# Patient Record
Sex: Female | Born: 1965 | Hispanic: Yes | Marital: Single | Smoking: Current some day smoker
Health system: Southern US, Community
[De-identification: ages and names within clinical notes are randomized; demographics above are authoritative.]

## PROBLEM LIST (undated history)

## (undated) DIAGNOSIS — I10 Essential (primary) hypertension: Secondary | ICD-10-CM

## (undated) DIAGNOSIS — E079 Disorder of thyroid, unspecified: Secondary | ICD-10-CM

---

## 2011-07-22 ENCOUNTER — Emergency Department: Payer: Self-pay | Admitting: Emergency Medicine

## 2011-07-22 LAB — COMPREHENSIVE METABOLIC PANEL
Albumin: 3.5 g/dL (ref 3.4–5.0)
Alkaline Phosphatase: 89 U/L (ref 50–136)
Anion Gap: 9 (ref 7–16)
BUN: 17 mg/dL (ref 7–18)
Bilirubin,Total: 0.3 mg/dL (ref 0.2–1.0)
Calcium, Total: 8.6 mg/dL (ref 8.5–10.1)
Chloride: 105 mmol/L (ref 98–107)
Co2: 27 mmol/L (ref 21–32)
Creatinine: 0.52 mg/dL — ABNORMAL LOW (ref 0.60–1.30)
EGFR (African American): >60
EGFR (Non-African Amer.): >60
Glucose: 85 mg/dL (ref 65–99)
Osmolality: 282 (ref 275–301)
Potassium: 3.1 mmol/L — ABNORMAL LOW (ref 3.5–5.1)
SGOT(AST): 23 U/L (ref 15–37)
SGPT (ALT): 20 U/L
Sodium: 141 mmol/L (ref 136–145)
Total Protein: 7.5 g/dL (ref 6.4–8.2)

## 2011-07-22 LAB — CK TOTAL AND CKMB (NOT AT ARMC)
CK, Total: 99 U/L (ref 21–215)
CK-MB: 0.5 ng/mL (ref 0.5–3.6)

## 2011-07-22 LAB — CBC
HCT: 39.6 % (ref 35.0–47.0)
HGB: 13.4 g/dL (ref 12.0–16.0)
MCH: 30.8 pg (ref 26.0–34.0)
MCHC: 33.9 g/dL (ref 32.0–36.0)
MCV: 91 fL (ref 80–100)
Platelet: 277 10*3/uL (ref 150–440)
RBC: 4.36 10*6/uL (ref 3.80–5.20)
RDW: 14.7 % — ABNORMAL HIGH (ref 11.5–14.5)
WBC: 8.7 10*3/uL (ref 3.6–11.0)

## 2011-07-22 LAB — TROPONIN I: Troponin-I: <0.02 ng/mL

## 2011-10-06 ENCOUNTER — Emergency Department: Payer: Self-pay | Admitting: Emergency Medicine

## 2011-10-06 LAB — CK TOTAL AND CKMB (NOT AT ARMC)
CK, Total: 196 U/L (ref 21–215)
CK-MB: 1.6 ng/mL (ref 0.5–3.6)

## 2011-10-06 LAB — BASIC METABOLIC PANEL
Anion Gap: 11 (ref 7–16)
BUN: 28 mg/dL — ABNORMAL HIGH (ref 7–18)
Calcium, Total: 8.9 mg/dL (ref 8.5–10.1)
Chloride: 102 mmol/L (ref 98–107)
Co2: 24 mmol/L (ref 21–32)
Creatinine: 0.85 mg/dL (ref 0.60–1.30)
EGFR (African American): >60
EGFR (Non-African Amer.): >60
Glucose: 93 mg/dL (ref 65–99)
Osmolality: 279 (ref 275–301)
Potassium: 3.2 mmol/L — ABNORMAL LOW (ref 3.5–5.1)
Sodium: 137 mmol/L (ref 136–145)

## 2011-10-06 LAB — CBC
HCT: 39.4 % (ref 35.0–47.0)
HGB: 14.1 g/dL (ref 12.0–16.0)
MCH: 32.1 pg (ref 26.0–34.0)
MCHC: 35.7 g/dL (ref 32.0–36.0)
MCV: 90 fL (ref 80–100)
Platelet: 264 10*3/uL (ref 150–440)
RBC: 4.39 10*6/uL (ref 3.80–5.20)
RDW: 12.9 % (ref 11.5–14.5)
WBC: 9.2 10*3/uL (ref 3.6–11.0)

## 2011-10-06 LAB — LIPASE, BLOOD: Lipase: 130 U/L (ref 73–393)

## 2011-10-06 LAB — TROPONIN I: Troponin-I: <0.02 ng/mL

## 2011-10-07 LAB — CK TOTAL AND CKMB (NOT AT ARMC)
CK, Total: 281 U/L — ABNORMAL HIGH (ref 21–215)
CK-MB: 1.4 ng/mL (ref 0.5–3.6)

## 2011-10-07 LAB — TROPONIN I: Troponin-I: <0.02 ng/mL

## 2011-10-07 LAB — TSH: Thyroid Stimulating Horm: 11.9 u[IU]/mL — ABNORMAL HIGH

## 2011-10-12 ENCOUNTER — Ambulatory Visit: Payer: Self-pay | Admitting: Family Medicine

## 2011-10-21 ENCOUNTER — Observation Stay: Payer: Self-pay | Admitting: Internal Medicine

## 2011-10-21 LAB — BASIC METABOLIC PANEL
Anion Gap: 10 (ref 7–16)
BUN: 24 mg/dL — ABNORMAL HIGH (ref 7–18)
Calcium, Total: 9.4 mg/dL (ref 8.5–10.1)
Chloride: 102 mmol/L (ref 98–107)
Co2: 25 mmol/L (ref 21–32)
Creatinine: 0.62 mg/dL (ref 0.60–1.30)
EGFR (African American): >60
EGFR (Non-African Amer.): >60
Glucose: 102 mg/dL — ABNORMAL HIGH (ref 65–99)
Osmolality: 278 (ref 275–301)
Potassium: 3.5 mmol/L (ref 3.5–5.1)
Sodium: 137 mmol/L (ref 136–145)

## 2011-10-21 LAB — CBC
HCT: 37.1 % (ref 35.0–47.0)
HGB: 13.3 g/dL (ref 12.0–16.0)
MCH: 32 pg (ref 26.0–34.0)
MCHC: 35.9 g/dL (ref 32.0–36.0)
MCV: 89 fL (ref 80–100)
Platelet: 224 10*3/uL (ref 150–440)
RBC: 4.15 10*6/uL (ref 3.80–5.20)
RDW: 13 % (ref 11.5–14.5)
WBC: 9.3 10*3/uL (ref 3.6–11.0)

## 2011-10-21 LAB — TROPONIN I
Troponin-I: <0.02 ng/mL
Troponin-I: <0.02 ng/mL

## 2011-10-21 LAB — CK TOTAL AND CKMB (NOT AT ARMC)
CK, Total: 194 U/L (ref 21–215)
CK-MB: 0.7 ng/mL (ref 0.5–3.6)

## 2011-11-03 ENCOUNTER — Emergency Department: Payer: Self-pay | Admitting: Emergency Medicine

## 2011-11-03 LAB — CBC WITH DIFFERENTIAL/PLATELET
Basophil #: 0 10*3/uL (ref 0.0–0.1)
Basophil %: 0.7 %
Eosinophil #: 0.1 10*3/uL (ref 0.0–0.7)
Eosinophil %: 1.1 %
HCT: 34.5 % — ABNORMAL LOW (ref 35.0–47.0)
HGB: 12.7 g/dL (ref 12.0–16.0)
Lymphocyte #: 1.8 10*3/uL (ref 1.0–3.6)
Lymphocyte %: 28.9 %
MCH: 32.9 pg (ref 26.0–34.0)
MCHC: 36.8 g/dL — ABNORMAL HIGH (ref 32.0–36.0)
MCV: 89 fL (ref 80–100)
Monocyte #: 0.5 x10 3/mm (ref 0.2–0.9)
Monocyte %: 8.1 %
Neutrophil #: 3.8 10*3/uL (ref 1.4–6.5)
Neutrophil %: 61.2 %
Platelet: 253 10*3/uL (ref 150–440)
RBC: 3.86 10*6/uL (ref 3.80–5.20)
RDW: 13.3 % (ref 11.5–14.5)
WBC: 6.3 10*3/uL (ref 3.6–11.0)

## 2014-04-03 IMAGING — CT CT CHEST W/ CM
1 series · 15 of 31 positions shown, 19 images · non-contrast
Comparison: none

REASON FOR EXAM: abn CXR done in ER nodule seen in left lung
COMMENTS:

[Series 2: chest w/ 3.0 i31f 2 · axial · 0.82mm/px · z∈[-343,-85]mm · 15 of 94 slices shown, 19 images]
[im 4/94  mediastinal]
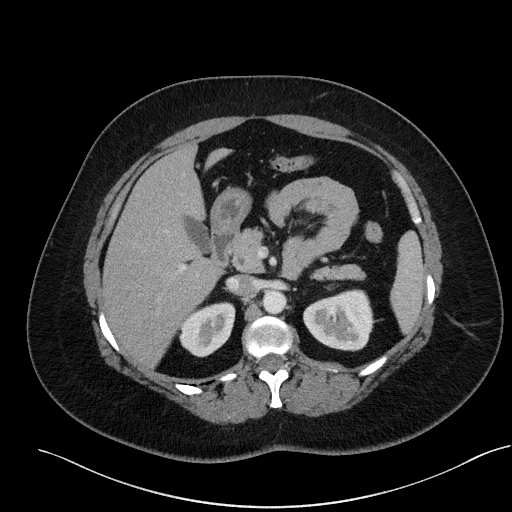
[im 4/94  lung]
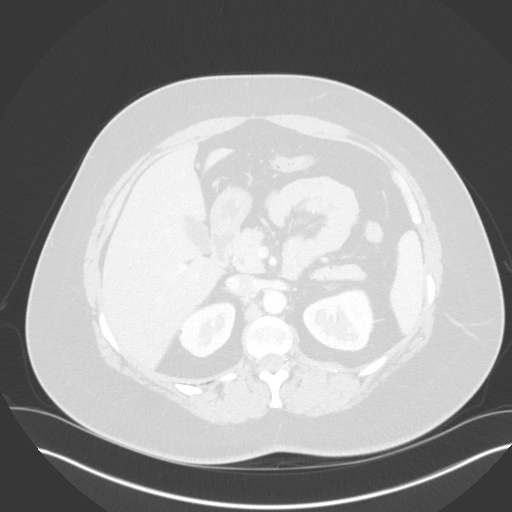
[im 11/94  lung]
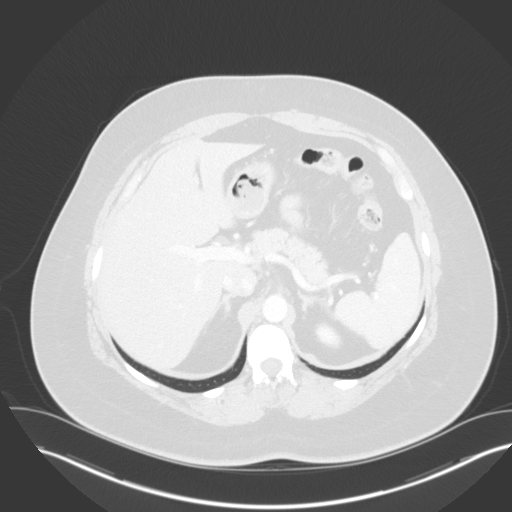
[im 18/94  lung]
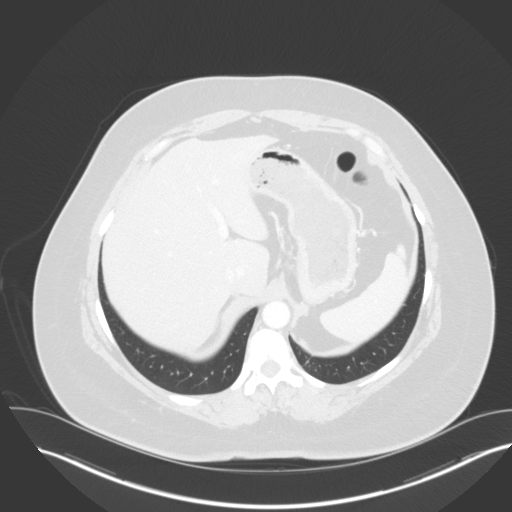
[im 21/94  lung]
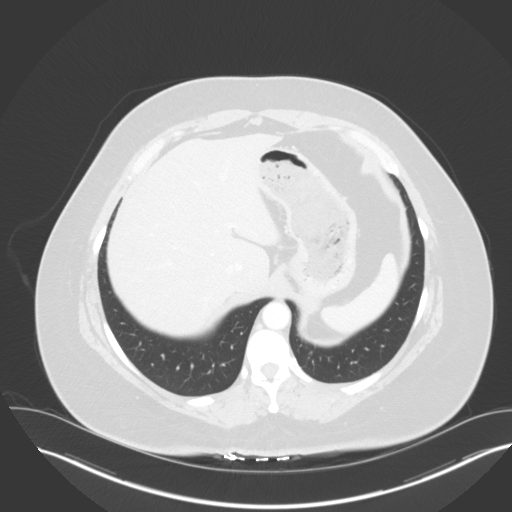
[im 28/94  mediastinal]
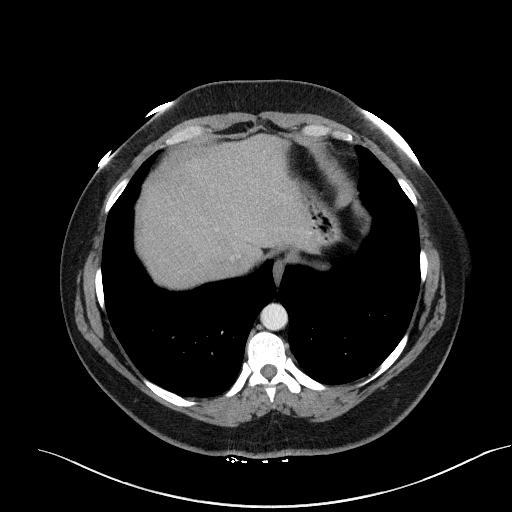
[im 28/94  lung]
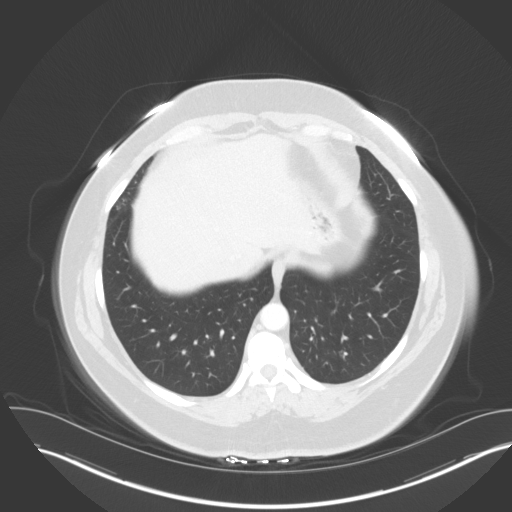
[im 35/94  lung]
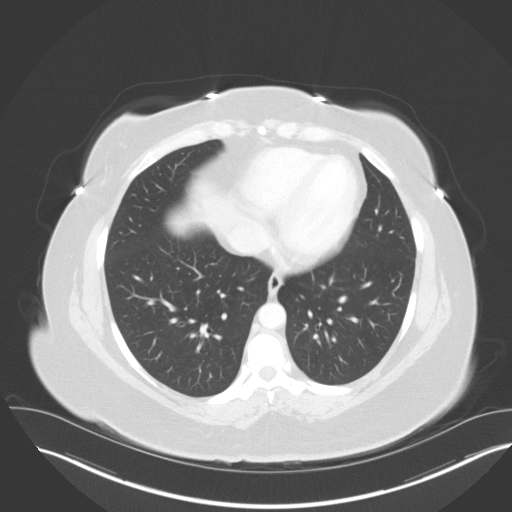
[im 42/94  lung]
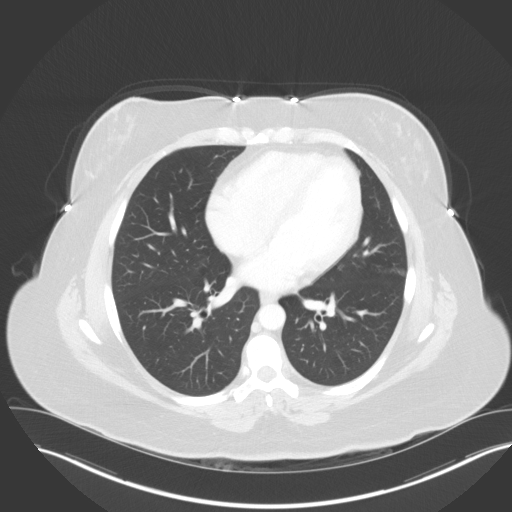
[im 49/94  lung]
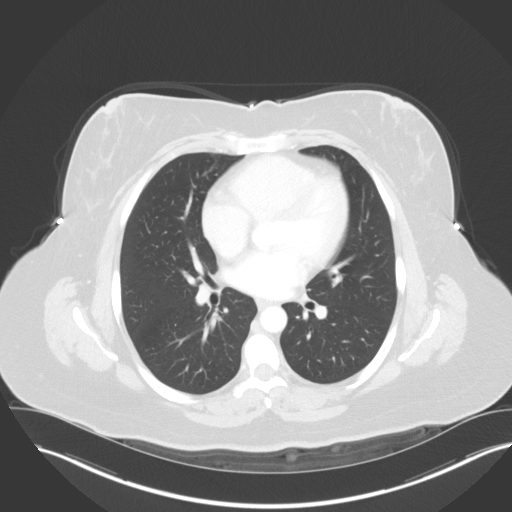
[im 52/94  mediastinal]
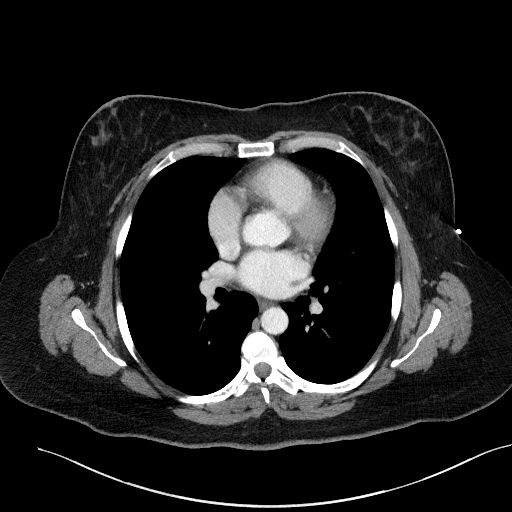
[im 52/94  lung]
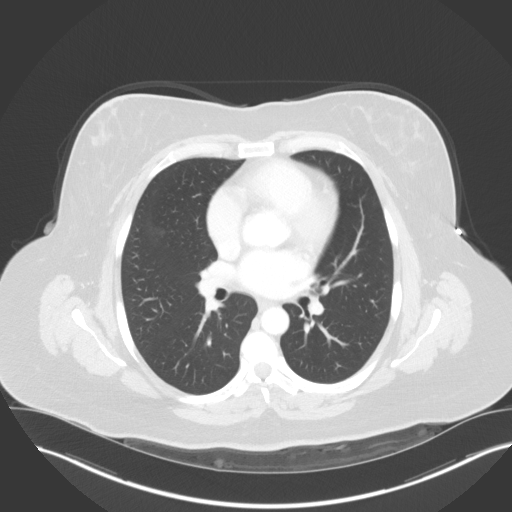
[im 59/94  lung]
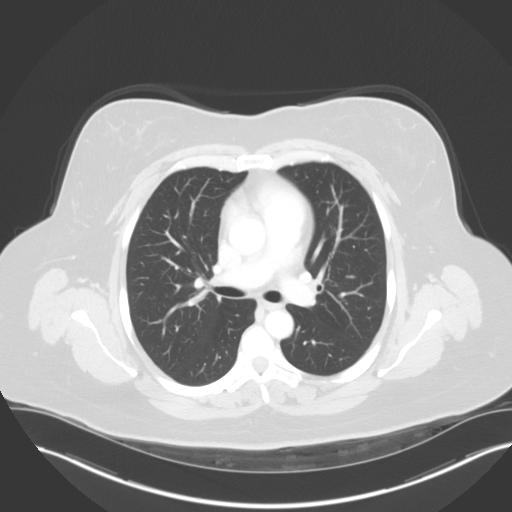
[im 66/94  lung]
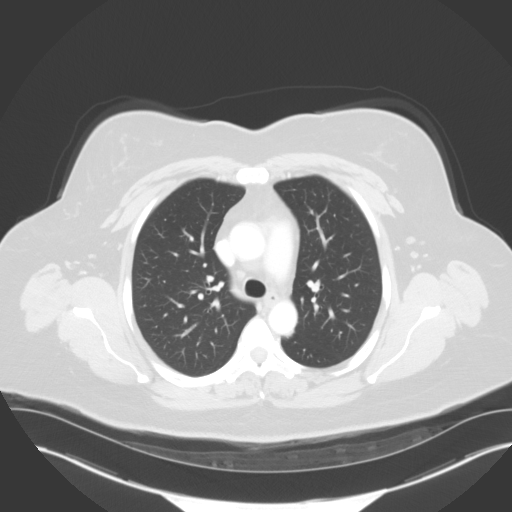
[im 73/94  lung]
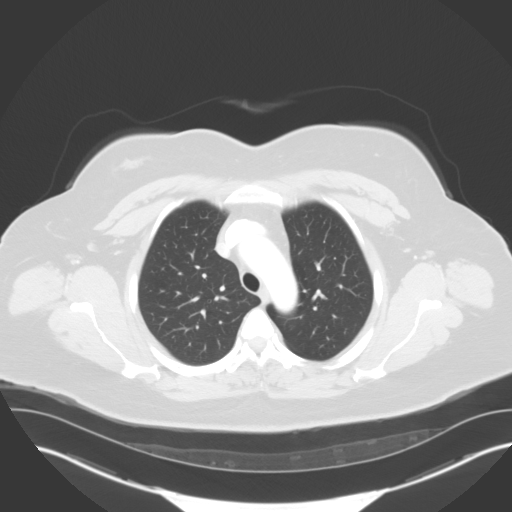
[im 76/94  mediastinal]
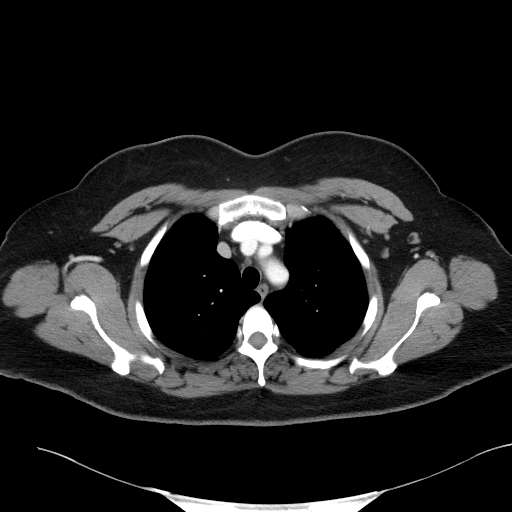
[im 76/94  lung]
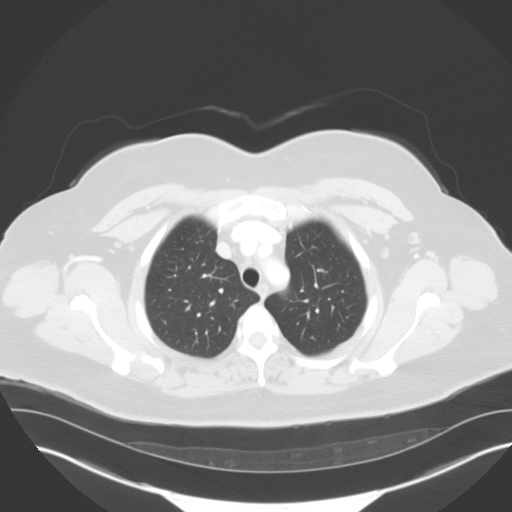
[im 83/94  lung]
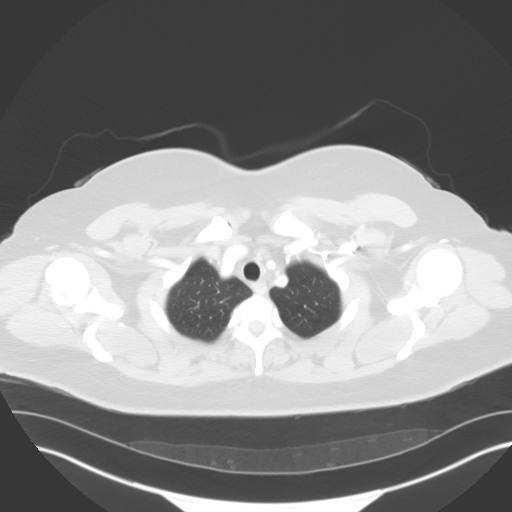
[im 90/94  lung]
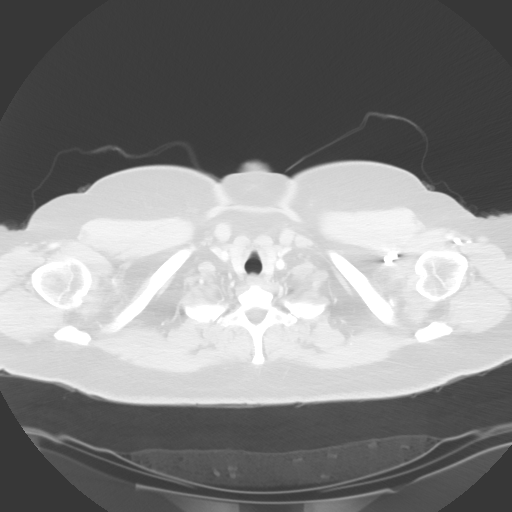

[15 of 31 positions shown; findings below may reference images not displayed]

PROCEDURE:     KCT - KCT CHEST WITH CONTRAST  - October 12, 2011  [DATE]

RESULT:     Axial CT scanning was performed through the chest with
reconstructions at 3 mm intervals and slice thicknesses. Review of
multiplanar reconstructed images was performed separately on the VIA
monitor. The patient received 75 cc of [id number]. Comparison is made to the
chest x-ray dated 06 October, 2011.

In the extreme inferior aspect of the anterior portion of the right upper
lobe there is a soft tissue density nodule present which measures 1.6 x
x 1 cm. No calcification is evident within it. I do not see abnormal nodules
elsewhere within either lung. The lungs are well-expanded. There is no
interstitial nor alveolar infiltrate. The cardiac chambers are normal in
size. The caliber of the thoracic aorta is normal. No pathologic sized
mediastinal or hilar lymph nodes are demonstrated. There is no pleural nor
pericardial effusion. Within the upper abdomen there are no adrenal masses.
The observed portions of the liver and spleen are normal in appearance. The
thoracic vertebral bodies are preserved in height.
IMPRESSION: There is an indeterminate soft tissue density nodule in the
extreme inferior aspect of the right upper lobe. Both benign and malignant
processes must be considered.

Pulmonary consultation or oncology consultation is recommended. Further
evaluation with PET CT scanning is recommended if no previous chest x-rays
are available for comparison. We have no such x-rays on file here at [HOSPITAL].

## 2014-04-25 ENCOUNTER — Emergency Department: Payer: Self-pay | Admitting: Emergency Medicine

## 2014-05-27 NOTE — H&P (Signed)
PATIENT NAME:  Suzanne NicelyLOPEZ, Marijean MR#:  [id number] DATE OF BIRTH:  1965-10-18  DATE OF ADMISSION:  10/21/2011  PRIMARY CARE PHYSICIAN: Suzanne Williams has no primary care physician.   CHIEF COMPLAINT: Chest pain.   HISTORY OF PRESENT ILLNESS: Suzanne Williams is 49 year old Hispanic female with history of hypertension and hypothyroidism and also pulmonary nodule. The patient has had several presentations to the emergency department with chest pain. This morning around 3:00 a.m. Suzanne Williams had chest pain that lasted one hour associated with shortness of breath. The pain was described as a pressure-like feeling in the mid chest with severity reaching 9 on a scale of 10. Suzanne Williams also described tingling in her hands and feet and jaw and Suzanne Williams feels weak. Her symptoms subsided by the time Suzanne Williams got here to the emergency department. Suzanne Williams reports no cough, no hemoptysis, no syncope, no abdominal pain, and no vomiting, although Suzanne Williams had loose stool last evening.   REVIEW OF SYSTEMS: CONSTITUTIONAL: Denies any fever. No chills. No fatigue. EYES: No blurring of vision. No double vision. ENT: No hearing impairment. No sore throat. No dysphagia. CARDIOVASCULAR: No edema and no syncope, but reports chest pain and shortness of breath which has subsided by now. RESPIRATORY: No cough, no sputum production, and no hemoptysis but reports chest pain as above. GASTROINTESTINAL: No abdominal pain, no vomiting, and no diarrhea. GENITOURINARY: No dysuria. No frequency of urination. Her last menstrual period started two days ago and usually it is once a month. MUSCULOSKELETAL: No joint pain or swelling. No muscular pain or swelling. INTEGUMENTARY: No skin rash. No ulcers. NEUROLOGY: No focal weakness. No seizure activity. No headache. PSYCHIATRY: No anxiety or depression. ENDOCRINE: No polyuria or polydipsia. No heat or cold intolerance.   PAST MEDICAL HISTORY:  1. Systemic hypertension. 2. Recurrent chest pain.  3. Hypothyroidism. Recent TSH on 10/06/2011 was  elevated reaching 11.  4. Right lung upper lobe pulmonary nodule. Suzanne Williams had outpatient work-up including CAT scan of the chest done on 10/12/2011. The results were indeterminate and they recommended followup with PET CT scan with pulmonary or oncology consultation.   PAST SURGICAL HISTORY: Negative.   SOCIAL HABITS: Nonsmoker. No history of alcohol or drug abuse.   SOCIAL HISTORY: The patient is single. Suzanne Williams works as a LawyerCNA.   FAMILY HISTORY: Her father died from a heart attack at the age of 50. Her mother suffers from coronary artery disease and diabetes mellitus.   ADMISSION MEDICATIONS:  1. Ranitidine 150 mg twice a day. 2. Levothyroxine 25 mcg once a day. 3. Hydrochlorothiazide 25 mg a day. 4. Atenolol used to take 50 mg twice a day but was stopped upon the recommendation from here during her last visit stating that her blood pressure was low and they told her to hold it.   ALLERGIES: No known drug allergies.   PHYSICAL EXAMINATION:   VITAL SIGNS: Blood pressure 183/100, respiratory rate 20, pulse 94, temperature 98.1, and oxygen saturation 100%.   GENERAL APPEARANCE: Young female lying in bed in no acute distress.   HEAD AND NECK: No pallor. No icterus. No cyanosis.   EARS, NOSE, AND THROAT: Hearing was normal. Nasal mucosa, lips, and tongue were normal.   EYES: Normal iris and conjunctivae. Pupils are about 3 to 4 mm, equal, and the pupils were small and difficult to elicit reactivity to light.   NECK: Supple. Trachea at midline. No thyromegaly. No cervical lymphadenopathy. No masses.   HEART: Normal S1 and S2. No S3 or S4. No murmur. No gallop.  No carotid bruits.   RESPIRATORY: Normal breathing pattern without use of accessory muscles. No rales. No wheezing.   ABDOMEN: Soft without tenderness. No hepatosplenomegaly. No masses. No hernias.   SKIN: No ulcers. No subcutaneous nodules.   MUSCULOSKELETAL: No joint swelling. No clubbing.   NEUROLOGIC: Cranial nerves II through  XII are intact. No focal motor deficit.   PSYCHIATRIC: The patient is alert and oriented x3. Mood and affect were normal.   LABORATORY, DIAGNOSTIC AND RADIOLOGIC DATA: EKG showed normal sinus rhythm at rate of 90 per minute, unremarkable EKG.  CAT scan of the chest done on 10/12/2011 showed right upper lobe pulmonary nodule in the inferior part. Differential diagnosis will be still benign versus malignant pulmonary nodule. The radiologist suggested to do PET scan with CAT scan with pulmonary or oncology consultation.   Serum glucose 102, BUN 24, creatinine 0.6, sodium 137, potassium 3.5, and calcium 9.4. Troponin less than 0.02. CBC showed white count of 9000, hemoglobin 13, hematocrit 37, and platelet count 224.   ASSESSMENT:  1. Chest pain for evaluation.  2. Systemic hypertension, uncontrolled.  3. Right-sided pulmonary nodule at the right upper lobe. 4. Hypothyroidism.   PLAN: We will admit the patient for observation. Follow-up on the troponin every 8 hours times additional two times. Schedule the patient to have nuclear stress test. Resume atenolol but instead of 50 mg twice a day make it 50 mg once a day and continue hydrochlorothiazide.          Followup on the pulmonary nodule should be pursued. This can be done as an outpatient or during this admission.   TIME SPENT EVALUATING THIS PATIENT: More than 50 minutes. ____________________________ [place]ney [place]. Rudene Re, MD amd:slb D: 10/21/2011 07:00:30 ET T: 10/21/2011 09:11:08 ET JOB#: [id number]  cc: [place]ney [place]. Rudene Re, MD, <Dictator> Zollie Scale MD ELECTRONICALLY SIGNED 10/22/2011 22:26

## 2014-05-27 NOTE — Discharge Summary (Signed)
PATIENT NAME:  Suzanne Williams, Mike MR#:  [id number] DATE OF BIRTH:  Dec 17, 1965  DATE OF ADMISSION:  10/21/2011 DATE OF DISCHARGE:  10/21/2011  PRESENTING COMPLAINT: Chest pain.   DISCHARGE DIAGNOSES:  1. Chest pain, appears atypical. Patient was ruled out for acute myocardial infarction.  2. Hypertension.  3. Hypothyroidism.  4. Right pulmonary nodule, workup as outpatient.   PROCEDURE: Myoview stress test which was negative for ischemia. Blood pressure is stable.   MEDICATIONS:  1. Hydrochlorothiazide 25 mg daily.  2. Ranitidine 150 mg p.o. b.i.d.  3. Levothyroxine 88 mcg p.o. daily.  4. Lisinopril 20 mg daily as needed.  5. Aspirin 81 mg daily.   FOLLOWUP: 1. Follow up with 1 to 2 weeks with [facility] for your right pulmonary nodule workup.  2. Patient advised to establish primary care in the area.   DATA:  1. Cardiac enzymes x3 negative.  2. Myoview stress test negative.  3. CBC within normal limits. Basic metabolic panel within normal limits.  4. EKG shows sinus rhythm with Q waves in inferior leads.  5. CT of the chest negative for PE. Indeterminate soft tissue density nodule in the extreme inferior aspect of the right upper lobe.  6. LFTs within normal limits.  7. TSH is 11.5. D-dimer 0.22.   BRIEF SUMMARY OF HOSPITAL COURSE: [name] is a 49 year old Caucasian female with history of hypertension and hypothyroidism along with lung nodules which she is getting workup as outpatient came in with:  1. Chest pain. The patient did have some shortness of breath and weakness both upper and lower extremities with some radiation to the back. Appeared nonspecific, atypical kind of presentation. She remained in sinus rhythm on EKG and telemetry. Her 3 sets of cardiac enzymes were negative. Myoview results were negative as well. Patient tolerated the stress test very well. She did not have any further symptoms of chest pain.  2. Hypertension. Lisinopril was continued.   3. Hypothyroidism, on Synthroid.  4. Patient has been tested for obstructive sleep apnea in the past. She stated her test results were normal.  5. Right upper lobe lung nodule. She is going to follow up [facility] which patient tells me has been set up.  6. Patient was discharged home in a stable condition.   CODE STATUS: SHE REMAINED A FULL CODE.   TIME SPENT: 40 minutes.     ____________________________ Wylie HailSona A. Allena KatzPatel, MD sap:vtd D: 10/31/2011 09:33:55 ET T: 11/01/2011 11:37:21 ET JOB#: [id number]  cc: Jermiah Soderman A. Allena KatzPatel, MD, <Dictator> Willow OraSONA A Lavita Pontius MD ELECTRONICALLY SIGNED 11/01/2011 16:11

## 2015-05-03 ENCOUNTER — Encounter: Payer: Self-pay | Admitting: Medical Oncology

## 2015-05-03 ENCOUNTER — Emergency Department
Admission: EM | Admit: 2015-05-03 | Discharge: 2015-05-03 | Disposition: A | Discharge status: Home / Self Care | Payer: Self-pay | Attending: Emergency Medicine | Admitting: Emergency Medicine

## 2015-05-03 DIAGNOSIS — J209 Acute bronchitis, unspecified: Secondary | ICD-10-CM | POA: Insufficient documentation

## 2015-05-03 DIAGNOSIS — H748X2 Other specified disorders of left middle ear and mastoid: Secondary | ICD-10-CM | POA: Insufficient documentation

## 2015-05-03 DIAGNOSIS — H6591 Unspecified nonsuppurative otitis media, right ear: Secondary | ICD-10-CM | POA: Insufficient documentation

## 2015-05-03 DIAGNOSIS — F17200 Nicotine dependence, unspecified, uncomplicated: Secondary | ICD-10-CM | POA: Insufficient documentation

## 2015-05-03 DIAGNOSIS — I10 Essential (primary) hypertension: Secondary | ICD-10-CM | POA: Insufficient documentation

## 2015-05-03 DIAGNOSIS — H6691 Otitis media, unspecified, right ear: Secondary | ICD-10-CM

## 2015-05-03 HISTORY — DX: Disorder of thyroid, unspecified: E07.9

## 2015-05-03 HISTORY — DX: Essential (primary) hypertension: I10

## 2015-05-03 MED ORDER — FLUTICASONE PROPIONATE 50 MCG/ACT NA SUSP: 2.0000 | Freq: Every day | NASAL | Status: AC

## 2015-05-03 MED ORDER — AMOXICILLIN 875 MG PO TABS: 875.0000 mg | ORAL_TABLET | Freq: Two times a day (BID) | ORAL | Status: DC

## 2015-05-03 MED ORDER — PSEUDOEPH-BROMPHEN-DM 30-2-10 MG/5ML PO SYRP
10.0000 mL | ORAL_SOLUTION | Freq: Four times a day (QID) | ORAL | Status: DC | PRN
Start: 2015-05-03 — End: 2018-01-30

## 2015-05-03 NOTE — ED Notes (Signed)
Pt reports cold sx's with ear ache and sore throat x 2 weeks.

## 2015-05-03 NOTE — Discharge Instructions (Signed)
Bronquitis aguda (Acute Bronchitis) La bronquitis es una inflamacin de las vas respiratorias que se extienden desde la trquea [facility] pulmones (bronquios). La inflamacin produce la formacin de mucosidad. Esto produce tos, que es el sntoma ms frecuente de la bronquitis.  Cuando la bronquitis es [place], generalmente comienza de [place] sbita y desaparece luego de un par de semanas. El hbito de fumar, las alergias y el asma pueden empeorar la bronquitis. Los episodios repetidos de bronquitis pueden causar ms problemas pulmonares.  CAUSAS La causa ms frecuente de bronquitis aguda es el mismo virus que produce el resfro. El virus puede propagarse de Ardelia Mems persona a la otra (contagioso) a travs de la tos y los estornudos, y al tocar objetos contaminados. [place].  Cristy Hilts.  Tos con mucosidad.  Dolores [employer] cuerpo.  Congestin en el pecho.  Escalofros.  Falta de aire.  Dolor de Investment banker, operational. DIAGNSTICO  La bronquitis aguda en general se diagnostica con un examen fsico. El mdico tambin le har preguntas sobre su historia clnica. En algunos casos se indican otros estudios, como radiografas, para Clinical research associate.  TRATAMIENTO  La bronquitis aguda generalmente desaparece en un par de semanas. Con frecuencia, no es Systems analyst. Los medicamentos se indican para aliviar la fiebre o la tos. Generalmente, no es necesario el uso de antibiticos, pero pueden recetarse en ciertas ocasiones. En algunos casos, se recomienda el uso de un inhalador para mejorar la falta de aire y Aeronautical engineer tos. Un vaporizador de aire fro podr ayudarlo a [employer] bronquiales y Armed forces technical officer su eliminacin.  INSTRUCCIONES PARA EL CUIDADO EN EL HOGAR  Descanse lo suficiente.  Beba lquidos en abundancia para mantener la orina de color claro o amarillo plido (excepto que padezca una enfermedad que requiera la restriccin de lquidos). El aumento  de lquidos puede ayudar a que las secreciones respiratorias (esputo) sean menos espesas y a reducir la congestin del pecho, y Mining engineer deshidratacin.  Tome los medicamentos solamente como se lo haya indicado el mdico.  Si le recetaron antibiticos, asegrese de terminarlos, incluso si comienza a sentirse mejor.  Evite fumar o aspirar el humo de otros fumadores. La exposicin al humo del cigarrillo o a irritantes qumicos har que la bronquitis empeore. Si fuma, considere el uso de goma de Higher education careers adviser o la aplicacin de parches en la piel que contengan nicotina para Public house manager los sntomas de abstinencia. Si deja de fumar, sus pulmones se curarn ms rpido.  Reduzca la probabilidad de otro episodio de bronquitis aguda lavando sus manos con frecuencia, evitando a las personas que tengan sntomas y tratando de no tocarse las manos con la boca, la nariz o los ojos.  Concurra a todas las visitas de control como se lo haya indicado el mdico. SOLICITE ATENCIN MDICA SI: Los sntomas no mejoran despus de una semana de [place].  SOLICITE ATENCIN MDICA DE INMEDIATO SI:  Comienza a tener fiebre o escalofros cada vez ms intensos.  Siente dolor en el pecho.  Le falta el aire de manera preocupante.  La flema tiene [place].  Se deshidrata.  Se desmaya o siente que va a desmayarse de forma repetida.  Tiene vmitos que se repiten.  Tiene un dolor de cabeza intenso. ASEGRESE DE QUE:   Comprende estas instrucciones.  Controlar su afeccin.  Recibir ayuda de inmediato si no mejora o si empeora.   Esta informacin no tiene Marine scientist el consejo del mdico. Asegrese de hacerle al mdico cualquier  pregunta que tenga.   Document Released: 01/24/2005 Document Revised: 02/14/2014 Elsevier Interactive Patient Education 2016 [employer]  Otitis media - Adultos (Otitis Media, Adult) La otitis media es el enrojecimiento, el dolor y la inflamacin del odo [place]. La causa de la  otitis media puede ser Vella Raringuna alergia o, ms frecuentemente, una infeccin. Muchas veces ocurre como una complicacin de un resfro comn. SIGNOS Y SNTOMAS Los sntomas de la otitis media son:  Dolor de odos.  Grant RutsFiebre.  Zumbidos en el odo.  Dolor de Turkmenistancabeza.  Prdida de lquido por el odo. DIAGNSTICO Para diagnosticar la otitis media, el mdico examinar su odo con un otoscopio. Este instrumento le permite al mdico observar el interior del odo y examinar el tmpano. El mdico le preguntar acerca de sus sntomas. TRATAMIENTO  Generalmente la otitis media mejora sin tratamiento entre 3 y los [address] das. El mdico podr recetar algunos medicamentos para Primary school teachercalmar el dolor. Si la otitis media no mejora dentro de los [address]  [address] o es recurrente, el mdico puede prescribir antibiticos si sospecha que la causa es una infeccin bacteriana. INSTRUCCIONES PARA EL CUIDADO EN EL HOGAR   Si le recetaron antibiticos, asegrese de terminarlos, incluso si comienza a sentirse mejor.  Tome los medicamentos solamente como se lo haya indicado el mdico.  Concurra a todas las visitas de control como se lo haya indicado el mdico. SOLICITE ATENCIN MDICA SI:  Tiene otitis media solo en un odo o sangra por la nariz, o ambas cosas.  Advierte un bulto en el cuello.  No mejora luego de 3 a 5 das.  Empeora en lugar de mejorar. SOLICITE ATENCIN MDICA DE INMEDIATO SI:   Aumenta el dolor y no puede controlarlo con Tourist information centre managerla medicacin.  Observa hinchazn, irritacin o dolor alrededor del odo o rigidez en el cuello.  Nota que una parte de su rostro est paralizada.  Nota que el hueso que se encuentra detrs de la oreja (mastoides) le duele al tocarlo. ASEGRESE DE QUE:   Comprende estas instrucciones.  Controlar su afeccin.  Recibir ayuda de inmediato si no mejora o si empeora.   Esta informacin no tiene Theme park managercomo fin reemplazar el consejo del mdico. Asegrese de hacerle al mdico cualquier pregunta que  tenga.   Document Released: 11/03/2004 Document Revised: 02/14/2014 Elsevier Interactive Patient Education [employer]2016 Elsevier Inc.

## 2015-05-03 NOTE — ED Notes (Signed)
Pt verbalized understanding of discharge instructions. NAD at this time. 

## 2015-05-03 NOTE — ED Provider Notes (Signed)
[facility] Emergency Department Provider Note  ____________________________________________  Time seen: Approximately 3:56 PM  I have reviewed the triage vital signs and the nursing notes.   HISTORY  Chief Complaint URI; Sore Throat; and Otalgia    HPI Suzanne Williams is a 50 y.o. female , NAD, presents emergency Department with 2-3 weeks of cold symptoms. States illness began with fever, chills, sweats and body aches. Patient was exposed to family members who had influenza were treated with Tamiflu therefore she decided to wait the illness out without medical treatment. Has had bilateral ear pain, sore throat and chest congestion that has not resolved over this time. Has tried over-the-counter medications with no significant relief of symptoms. Denies any drainage from the ears. Has not had any headaches, dizziness. Denies chest pain, back pain, abdominal pain, nausea, vomiting, diarrhea.   Past Medical History  Diagnosis Date  . Hypertension   . Thyroid disease     There are no active problems to display for this patient.   History reviewed. No pertinent past surgical history.  Current Outpatient Rx  Name  Route  Sig  Dispense  Refill  . amoxicillin (AMOXIL) 875 MG tablet   Oral   Take 1 tablet (875 mg total) by mouth 2 (two) times daily.   20 tablet   0   . brompheniramine-pseudoephedrine-DM 30-2-10 MG/5ML syrup   Oral   Take 10 mLs by mouth 4 (four) times daily as needed.   200 mL   0   . fluticasone (FLONASE) 50 MCG/ACT nasal spray   Each Nare   Place 2 sprays into both nostrils daily.   16 g   0     Allergies Review of patient's allergies indicates no known allergies.  No family history on file.  Social History Social History  Substance Use Topics  . Smoking status: Current Every Day Smoker  . Smokeless tobacco: None  . Alcohol Use: None     Review of Systems  Constitutional: No fever/chills, fatigue Eyes: No visual  changes. No discharge, redness, swelling ENT: Positive nasal congestion, ear pain, sore throat. No sinus pressure. Cardiovascular: No chest pain. Respiratory: Positive chest congestion, cough. No shortness of breath. No wheezing.  Gastrointestinal: No abdominal pain.  No nausea, vomiting.  No diarrhea. Musculoskeletal: Negative for back pain.  Skin: Negative for rash. Neurological: Negative for headaches, focal weakness or numbness. 10-point ROS otherwise negative.  ____________________________________________   PHYSICAL EXAM:  VITAL SIGNS: ED Triage Vitals  Enc Vitals Group     BP 05/03/15 1540 133/66 mmHg     Pulse Rate 05/03/15 1540 80     Resp 05/03/15 1540 20     Temp 05/03/15 1540 98.1 F (36.7 C)     Temp Source 05/03/15 1540 Oral     SpO2 05/03/15 1540 98 %     Weight 05/03/15 1539 180 lb (81.647 kg)     Height 05/03/15 1539 5\' 2"  (1.575 m)     Head Cir --      Peak Flow --      Pain Score 05/03/15 1539 5     Pain Loc --      Pain Edu? --      Excl. in GC? --     Constitutional: Alert and oriented. Well appearing and in no acute distress. Eyes: Conjunctivae are normal. PERRL. EOMI without pain.  Head: Atraumatic. ENT:      Ears: Right TM with moderate serous effusion and moderate erythema with mild  retraction. No perforation and light reflex is dull. Left TM with mild serous effusion and mild bulging but no erythema or perforation. White reflexes within normal limits.      Nose: No congestion/rhinnorhea.      Mouth/Throat: Mucous membranes are moist. Clear postnasal drip. Pharynx without erythema, swelling, exudates. Neck: Supple with full range of motion. Hematological/Lymphatic/Immunilogical: No cervical lymphadenopathy. Cardiovascular: Normal rate, regular rhythm. Normal S1 and S2.  Good peripheral circulation. Respiratory: Normal respiratory effort without tachypnea or retractions. Lungs CTAB with breath sounds noted throughout all lung fields. Neurologic:   Normal speech and language. No gross focal neurologic deficits are appreciated.  Skin:  Skin is warm, dry and intact. No rash noted. Psychiatric: Mood and affect are normal. Speech and behavior are normal. Patient exhibits appropriate insight and judgement.   ____________________________________________   LABS  None  ____________________________________________  EKG  None ____________________________________________  RADIOLOGY  None ____________________________________________    PROCEDURES  Procedure(s) performed: None    Medications - No data to display   ____________________________________________   INITIAL IMPRESSION / ASSESSMENT AND PLAN / ED COURSE  Patient's diagnosis is consistent with acute right otitis media and bronchitis after viral infection. Patient will be discharged home with prescriptions for amoxicillin, Bromfed-DM, Flonase to take as directed. May continue Tylenol or ibuprofen as needed for aches or pains. Patient is to follow up with [facility] community clinic if symptoms persist past this treatment course. Patient is given ED precautions to return to the ED for any worsening or new symptoms.    ____________________________________________  FINAL CLINICAL IMPRESSION(S) / ED DIAGNOSES  Final diagnoses:  Acute right otitis media, recurrence not specified, unspecified otitis media type  Acute bronchitis, unspecified organism      NEW MEDICATIONS STARTED DURING THIS VISIT:  New Prescriptions   AMOXICILLIN (AMOXIL) 875 MG TABLET    Take 1 tablet (875 mg total) by mouth 2 (two) times daily.   BROMPHENIRAMINE-PSEUDOEPHEDRINE-DM 30-2-10 MG/5ML SYRUP    Take 10 mLs by mouth 4 (four) times daily as needed.   FLUTICASONE (FLONASE) 50 MCG/ACT NASAL SPRAY    Place 2 sprays into both nostrils daily.         Hope Pigeon, PA-C 05/03/15 1629  Jennye Moccasin, MD 05/03/15 [phone]

## 2015-05-09 ENCOUNTER — Emergency Department: Payer: Self-pay

## 2015-05-09 ENCOUNTER — Emergency Department
Admission: EM | Admit: 2015-05-09 | Discharge: 2015-05-09 | Disposition: A | Discharge status: Home / Self Care | Payer: Self-pay | Attending: Emergency Medicine | Admitting: Emergency Medicine

## 2015-05-09 ENCOUNTER — Encounter: Payer: Self-pay | Admitting: *Deleted

## 2015-05-09 DIAGNOSIS — J40 Bronchitis, not specified as acute or chronic: Secondary | ICD-10-CM | POA: Insufficient documentation

## 2015-05-09 DIAGNOSIS — Z792 Long term (current) use of antibiotics: Secondary | ICD-10-CM | POA: Insufficient documentation

## 2015-05-09 DIAGNOSIS — I10 Essential (primary) hypertension: Secondary | ICD-10-CM | POA: Insufficient documentation

## 2015-05-09 DIAGNOSIS — Z7951 Long term (current) use of inhaled steroids: Secondary | ICD-10-CM | POA: Insufficient documentation

## 2015-05-09 DIAGNOSIS — F17210 Nicotine dependence, cigarettes, uncomplicated: Secondary | ICD-10-CM | POA: Insufficient documentation

## 2015-05-09 MED ORDER — IPRATROPIUM-ALBUTEROL 0.5-2.5 (3) MG/3ML IN SOLN
3.0000 mL | Freq: Once | RESPIRATORY_TRACT | Status: AC
  Administered 2015-05-09: 3 mL via RESPIRATORY_TRACT
  Filled 2015-05-09: qty 3

## 2015-05-09 NOTE — ED Notes (Signed)
States she was diagnoised with the flu, ear infection, and bronchitis 3 weeks ago and was given antibiotcs (amoxillin) that she has taken for 4 days and states she is not feeling any better, pt awake and alert in no acute distress

## 2015-05-09 NOTE — Discharge Instructions (Signed)
Infección del tracto respiratorio superior, adultos  (Upper Respiratory Infection, Adult)  La mayoría de las infecciones del tracto respiratorio superior son infecciones virales de las vías que llevan el aire a los pulmones. Un infección del tracto respiratorio superior afecta la nariz, la garganta y las vías respiratorias superiores. El tipo más frecuente de infección del tracto respiratorio superior es la nasofaringitis, que habitualmente se conoce como "resfrío común".  Las infecciones del tracto respiratorio superior siguen su curso y por lo general se curan solas. En la mayoría de los casos, la infección del tracto respiratorio superior no requiere atención médica, pero a veces, después de una infección viral, puede surgir una infección bacteriana en las vías respiratorias superiores. Esto se conoce como infección secundaria. Las infecciones sinusales y en el oído medio son tipos frecuentes de infecciones secundarias en el tracto respiratorio superior.  La neumonía bacteriana también puede complicar un cuadro de infección del tracto respiratorio superior. Este tipo de infección puede empeorar el asma y la enfermedad pulmonar obstructiva crónica (EPOC). En algunos casos, estas complicaciones pueden requerir atención médica de emergencia y poner en peligro la vida.   CAUSAS  Casi todas las infecciones del tracto respiratorio superior se deben a los virus. Un virus es un tipo de microbio que puede contagiarse de una persona a otra.   FACTORES DE RIESGO  Puede estar en riesgo de sufrir una infección del tracto respiratorio superior si:   · Fuma.  · Tiene una enfermedad pulmonar o cardíaca crónica.  · Tiene debilitado el sistema de defensa (inmunitario) del cuerpo.  · Es muy joven o de edad muy avanzada.  · Tiene asma o alergias nasales.  · Trabaja en áreas donde hay mucha gente o poca ventilación.  · Trabaja en una escuela o en un centro de atención médica.  SIGNOS Y SÍNTOMAS   Habitualmente, los síntomas aparecen  de 2 a 3 días después de entrar en contacto con el virus del resfrío. La mayoría de las infecciones virales en el tracto respiratorio superior duran de 7 a 10 días. Sin embargo, las infecciones virales en el tracto respiratorio superior a causa del virus de la gripe pueden durar de 14 a 18 días y, habitualmente, son más graves. Entre los síntomas se pueden incluir los siguientes:   · Secreción o congestión nasal.  · Estornudos.  · Tos.  · Dolor de garganta.  · Dolor de cabeza.  · Fatiga.  · Fiebre.  · Pérdida del apetito.  · Dolor en la frente, detrás de los ojos y por encima de los pómulos (dolor sinusal).  · Dolores musculares.  DIAGNÓSTICO   El médico puede diagnosticar una infección del tracto respiratorio superior mediante los siguientes estudios:  · Examen físico.  · Pruebas para verificar si los síntomas no se deben a otra afección, por ejemplo:    Faringitis estreptocócica.    Sinusitis.    Neumonía.    Asma.  TRATAMIENTO   Esta infección desaparece sola, con el tiempo. No puede curarse con medicamentos, pero a menudo se prescriben para aliviar los síntomas. Los medicamentos pueden ser útiles para lo siguiente:  · Bajar la fiebre.  · Reducir la tos.  · Aliviar la congestión nasal.  INSTRUCCIONES PARA EL CUIDADO EN EL HOGAR   · Tome los medicamentos solamente como se lo haya indicado el médico.  · A fin de aliviar el dolor de garganta, haga gárgaras con solución salina templada o consuma caramelos para la tos, como se lo haya indicado el médico.  · Use un humidificador   de vapor cálido o inhale el vapor de la ducha para aumentar la humedad del aire. Esto facilitará la respiración.  · Beba suficiente líquido para mantener la orina clara o de color amarillo pálido.  · Consuma sopas y otros caldos transparentes, y aliméntese bien.  · Descanse todo lo que sea necesario.  · Regrese al trabajo cuando la temperatura se le haya normalizado o cuando el médico lo autorice. Es posible que deba quedarse en su casa durante  un tiempo prolongado, para no infectar a los demás. También puede usar un barbijo y lavarse las manos con cuidado para evitar la propagación del virus.  · Aumente el uso del inhalador si tiene asma.  · No consuma ningún producto que contenga tabaco, lo que incluye cigarrillos, tabaco de mascar o cigarrillos electrónicos. Si necesita ayuda para dejar de fumar, consulte al médico.  PREVENCIÓN   La mejor manera de protegerse de un resfrío es mantener una higiene adecuada.   · Evite el contacto oral o físico con personas que tengan síntomas de resfrío.  · En caso de contacto, lávese las manos con frecuencia.  No hay pruebas claras de que la vitamina C, la vitamina E, la equinácea o el ejercicio reduzcan la probabilidad de contraer un resfrío. Sin embargo, siempre se recomienda descansar mucho, hacer ejercicio y alimentarse bien.   SOLICITE ATENCIÓN MÉDICA SI:   · Su estado empeora en lugar de mejorar.  · Los medicamentos no logran controlar los síntomas.  · Tiene escalofríos.  · La sensación de falta de aire empeora.  · Tiene mucosidad marrón o roja.  · Tiene secreción nasal amarilla o marrón.  · Le duele la cara, especialmente al inclinarse hacia adelante.  · Tiene fiebre.  · Tiene los ganglios del cuello hinchados.  · Siente dolor al tragar.  · Tiene zonas blancas en la parte de atrás de la garganta.  SOLICITE ATENCIÓN MÉDICA DE INMEDIATO SI:   · Tiene síntomas intensos o persistentes de:    Dolor de cabeza.    Dolor de oídos.    Dolor sinusal.    Dolor en el pecho.  · Tiene enfermedad pulmonar crónica y cualquiera de estos síntomas:    Sibilancias.    Tos prolongada.    Tos con sangre.    Cambio en la mucosidad habitual.  · Presenta rigidez en el cuello.  · Tiene cambios en:    La visión.    La audición.    El pensamiento.    El estado de ánimo.  ASEGÚRESE DE QUE:   · Comprende estas instrucciones.  · Controlará su afección.  · Recibirá ayuda de inmediato si no mejora o si empeora.     Esta información no tiene como  fin reemplazar el consejo del médico. Asegúrese de hacerle al médico cualquier pregunta que tenga.     Document Released: 11/03/2004 Document Revised: 06/10/2014  Elsevier Interactive Patient Education ©2016 [employer]

## 2015-05-09 NOTE — ED Notes (Signed)
Patient transported to x-ray. ?

## 2015-05-11 NOTE — ED Provider Notes (Signed)
[facility] Emergency Department Provider Note  ____________________________________________    I have reviewed the triage vital signs and the nursing notes.   HISTORY  Chief Complaint Otalgia; Chest Pain; and Cough    HPI Merleen NicelyLourdes Kitko is a 50 y.o. female who presents with complaints of cough. She notes that she has been coughing for several weeks and seemed to improve re-flea but now she is coughing again. She was treated with amoxicillin with little improvement. She denies shortness of breath. No recent travel.     Past Medical History  Diagnosis Date  . Hypertension   . Thyroid disease     There are no active problems to display for this patient.   History reviewed. No pertinent past surgical history.  Current Outpatient Rx  Name  Route  Sig  Dispense  Refill  . amoxicillin (AMOXIL) 875 MG tablet   Oral   Take 1 tablet (875 mg total) by mouth 2 (two) times daily.   20 tablet   0   . brompheniramine-pseudoephedrine-DM 30-2-10 MG/5ML syrup   Oral   Take 10 mLs by mouth 4 (four) times daily as needed.   200 mL   0   . fluticasone (FLONASE) 50 MCG/ACT nasal spray   Each Nare   Place 2 sprays into both nostrils daily.   16 g   0     Allergies Review of patient's allergies indicates no known allergies.  History reviewed. No pertinent family history.  Social History Social History  Substance Use Topics  . Smoking status: Current Every Day Smoker  . Smokeless tobacco: None  . Alcohol Use: None    Review of Systems  Constitutional: Negative for fever. Eyes: Negative for redness ENT: Negative for sore throat Cardiovascular: Negative for chest pain Respiratory: Negative for shortness of breath.  Musculoskeletal: Negative for Myalgias Skin: Negative for rash. Neurological: Negative for headache Psychiatric: no anxiety    ____________________________________________   PHYSICAL EXAM:  VITAL SIGNS: ED Triage Vitals   Enc Vitals Group     BP 05/09/15 1634 125/80 mmHg     Pulse Rate 05/09/15 1634 83     Resp 05/09/15 1634 18     Temp 05/09/15 1634 98.7 F (37.1 C)     Temp Source 05/09/15 1634 Oral     SpO2 05/09/15 1634 98 %     Weight 05/09/15 1634 180 lb (81.647 kg)     Height 05/09/15 1634 5\' 1"  (1.549 m)     Head Cir --      Peak Flow --      Pain Score 05/09/15 1635 7     Pain Loc --      Pain Edu? --      Excl. in GC? --      Constitutional: Alert and oriented. Well appearing and in no distress.  Eyes: Conjunctivae are normal. No erythema or injection ENT   Head: Normocephalic and atraumatic.   Mouth/Throat: Mucous membranes are moist. Cardiovascular: Normal rate, regular rhythm. Normal and symmetric distal pulses are present in the upper extremities. Respiratory: Normal respiratory effort without tachypnea nor retractions. Breath sounds are clear and equal bilaterally.  Gastrointestinal: Soft and non-tender in all quadrants. No distention. There is no CVA tenderness.  Musculoskeletal: Nontender with normal range of motion in all extremities. No lower extremity tenderness nor edema. Neurologic:  Normal speech and language. No gross focal neurologic deficits are appreciated. Skin:  Skin is warm, dry and intact. No rash noted. Psychiatric: Mood  and affect are normal. Patient exhibits appropriate insight and judgment.  ____________________________________________    LABS (pertinent positives/negatives)  Labs Reviewed - No data to display  ____________________________________________   EKG  None  ____________________________________________    RADIOLOGY  Chest x-ray unremarkable  ____________________________________________   PROCEDURES  Procedure(s) performed: none  Critical Care performed: none  ____________________________________________   INITIAL IMPRESSION / ASSESSMENT AND PLAN / ED COURSE  Pertinent labs & imaging results that were available  during my care of the patient were reviewed by me and considered in my medical decision making (see chart for details).  Patient well-appearing and in no distress. Symptoms consistent with bronchitis. We will give DuoNeb and check chest x-ray  ____________________________________________   FINAL CLINICAL IMPRESSION(S) / ED DIAGNOSES  Final diagnoses:  Bronchitis          Jene Every, MD 05/11/15 [phone]

## 2015-12-15 ENCOUNTER — Ambulatory Visit
Admission: RE | Admit: 2015-12-15 | Discharge: 2015-12-15 | Disposition: A | Discharge status: Home / Self Care | Payer: Self-pay | Source: Ambulatory Visit | Attending: Family Medicine | Admitting: Family Medicine

## 2015-12-15 ENCOUNTER — Other Ambulatory Visit: Payer: Self-pay | Admitting: Family Medicine

## 2015-12-15 DIAGNOSIS — M79641 Pain in right hand: Secondary | ICD-10-CM

## 2015-12-15 DIAGNOSIS — M25531 Pain in right wrist: Secondary | ICD-10-CM

## 2016-01-16 ENCOUNTER — Encounter: Payer: Self-pay | Admitting: Emergency Medicine

## 2016-01-16 ENCOUNTER — Emergency Department: Payer: Self-pay

## 2016-01-16 ENCOUNTER — Emergency Department
Admission: EM | Admit: 2016-01-16 | Discharge: 2016-01-16 | Disposition: A | Discharge status: Home / Self Care | Payer: Self-pay | Attending: Emergency Medicine | Admitting: Emergency Medicine

## 2016-01-16 DIAGNOSIS — F17200 Nicotine dependence, unspecified, uncomplicated: Secondary | ICD-10-CM | POA: Insufficient documentation

## 2016-01-16 DIAGNOSIS — I10 Essential (primary) hypertension: Secondary | ICD-10-CM | POA: Insufficient documentation

## 2016-01-16 DIAGNOSIS — Z79899 Other long term (current) drug therapy: Secondary | ICD-10-CM | POA: Insufficient documentation

## 2016-01-16 DIAGNOSIS — B349 Viral infection, unspecified: Secondary | ICD-10-CM | POA: Insufficient documentation

## 2016-01-16 DIAGNOSIS — R112 Nausea with vomiting, unspecified: Secondary | ICD-10-CM

## 2016-01-16 DIAGNOSIS — R197 Diarrhea, unspecified: Secondary | ICD-10-CM

## 2016-01-16 MED ORDER — PROMETHAZINE HCL 25 MG PO TABS: 25.0000 mg | ORAL_TABLET | Freq: Four times a day (QID) | ORAL | 0 refills | Status: DC | PRN

## 2016-01-16 NOTE — ED Triage Notes (Signed)
Patient to ED via POV for c/o cough, congestion, body aches, fever, chills. Patient states that she was seen by her PCP on Monday and diagnosed with bronchitis and flue like symptoms. Patient states that she was given amoxicillin and albuterol inhaler but her symptoms have not gotten any better. Patient states that this morning her temperature was 102.

## 2016-01-16 NOTE — ED Provider Notes (Signed)
[facility] Emergency Department Provider Note  ____________________________________________  Time seen: Approximately 6:45 PM  I have reviewed the triage vital signs and the nursing notes.   HISTORY  Chief Complaint Cough    HPI Suzanne Williams is a 50 y.o. female , NAD, presents to the emergency department with 1 week history of cough, chest congestion, body aches, fever, chills, nausea, vomiting and diarrhea. Patient states she was seen by her primary care provider last Monday in regards to her symptoms. Was placed on amoxicillin and given an albuterol inhaler. States that she felt better yesterday, returned to work today and could only work for 4 hours as her body aches, chills and cough returned. Denies any chest pain, shortness of breath or wheezing. Has not had any abdominal pain. Denies any dysuria, hematuria or other urinary changes. Has had some diarrhea but no constipation. Has been keeping up fluid intake by drinking Gatorade but has had a decreased appetite due to nausea. No known sick contacts. Has had no headaches, lightheadedness or dizziness.   Past Medical History:  Diagnosis Date  . Hypertension   . Thyroid disease     There are no active problems to display for this patient.   History reviewed. No pertinent surgical history.  Prior to Admission medications   Medication Sig Start Date End Date Taking? Authorizing Provider  amoxicillin (AMOXIL) 875 MG tablet Take 1 tablet (875 mg total) by mouth 2 (two) times daily. 05/03/15   Jami L Hagler, PA-C  brompheniramine-pseudoephedrine-DM 30-2-10 MG/5ML syrup Take 10 mLs by mouth 4 (four) times daily as needed. 05/03/15   Jami L Hagler, PA-C  fluticasone (FLONASE) 50 MCG/ACT nasal spray Place 2 sprays into both nostrils daily. 05/03/15   Jami L Hagler, PA-C  promethazine (PHENERGAN) 25 MG tablet Take 1 tablet (25 mg total) by mouth every 6 (six) hours as needed for nausea or vomiting. 01/16/16   Jami L  Hagler, PA-C    Allergies Patient has no known allergies.  No family history on file.  Social History Social History  Substance Use Topics  . Smoking status: Current Every Day Smoker  . Smokeless tobacco: Never Used  . Alcohol use No     Review of Systems  Constitutional: Positive fever, chills, fatigue. Eyes: No visual changes. No discharge ENT: No sore throat. Cardiovascular: No chest pain. Respiratory: Positive chest congestion, cough. No shortness of breath. No wheezing.  Gastrointestinal: Positive nausea, vomiting, diarrhea. No abdominal pain. No constipation. Genitourinary: Negative for dysuria, hematuria. No urinary hesitancy, urgency or increased frequency. Musculoskeletal: Positive for general myalgias.  Skin: Negative for rash. Neurological: Negative for headaches, focal weakness or numbness. No tingling. No lightheadedness or dizziness. 10-point ROS otherwise negative.  ____________________________________________   PHYSICAL EXAM:  VITAL SIGNS: ED Triage Vitals  Enc Vitals Group     BP 01/16/16 1822 (!) 162/99     Pulse Rate 01/16/16 1822 (!) 104     Resp 01/16/16 1822 16     Temp 01/16/16 1822 98 F (36.7 C)     Temp Source 01/16/16 1822 Oral     SpO2 01/16/16 1822 99 %     Weight 01/16/16 1823 180 lb (81.6 kg)     Height 01/16/16 1823 5\' 2"  (1.575 m)     Head Circumference --      Peak Flow --      Pain Score 01/16/16 1823 4     Pain Loc --      Pain Edu? --  Excl. in GC? --      Constitutional: Alert and oriented. Well appearing and in no acute distress. Eyes: Conjunctivae are normal Without icterus or injection. Head: Atraumatic. ENT:      Nose: No congestion/rhinnorhea.      Mouth/Throat: Mucous membranes are moist. Pharynx without erythema, swelling, exudate. Uvula is midline. Airways patent. Neck: Supple with full range of motion. Hematological/Lymphatic/Immunilogical: No cervical lymphadenopathy. Cardiovascular: Normal rate,  regular rhythm. Normal S1 and S2. No murmurs, rubs, gallops. Good peripheral circulation. Respiratory: Normal respiratory effort without tachypnea or retractions. Lungs CTAB with breath sounds noted in all lung fields. No wheeze, rhonchi, rales. Gastrointestinal: Soft and nontender without distention or guarding in all quadrants. No hepatosplenomegaly. No masses, rebound or rigidity. Bowel sounds grossly normal active in all quadrants. Musculoskeletal: Motion bilateral upper and lower extremities without pain or difficulty. Neurologic:  Normal speech and language. No gross focal neurologic deficits are appreciated.  Skin:  Skin is warm, dry and intact. No rash noted. Normal skin turgor. Psychiatric: Mood and affect are normal. Speech and behavior are normal. Patient exhibits appropriate insight and judgement.   ____________________________________________   LABS  None ____________________________________________  EKG  None ____________________________________________  RADIOLOGY I, Hope PigeonJami L Hagler, personally viewed and evaluated these images (plain radiographs) as part of my medical decision making, as well as reviewing the written report by the radiologist.  Dg Chest 2 View  Result Date: 01/16/2016 CLINICAL DATA:  Nausea, vomiting, and diarrhea. EXAM: CHEST  2 VIEW COMPARISON:  Multiple chest x-rays since October 06, 2011 and a chest CT from October 12, 2011 FINDINGS: The nodule in the right mid lung is unchanged since 2013. No pneumothorax. No other nodules or masses. No focal infiltrates. The heart, hila, and mediastinum are within normal limits. Bones and soft tissues are unremarkable. IMPRESSION: No active cardiopulmonary disease. Electronically Signed   By: Gerome Samavid  Williams III M.D   On: 01/16/2016 19:06    ____________________________________________    PROCEDURES  Procedure(s) performed: None   Procedures   Medications - No data to  display   ____________________________________________   INITIAL IMPRESSION / ASSESSMENT AND PLAN / ED COURSE  Pertinent labs & imaging results that were available during my care of the patient were reviewed by me and considered in my medical decision making (see chart for details).  Clinical Course     Patient's diagnosis is consistent with Viral illness causing nausea, vomiting and diarrhea. At the time of discharge patient's vital signs were within normal limits with a pulse rate of 93, blood pressure of 131/89 and respiration rate of 18 with an oxygen saturation of 97%. Chest x-ray was negative. Patient will be discharged home with prescriptions for promethazine to take as needed for nausea or vomiting. Patient encouraged to continue with her hydration efforts. Patient is to follow up with her primary care provider if symptoms persist past this treatment course. Patient is given ED precautions to return to the ED for any worsening or new symptoms.    ____________________________________________  FINAL CLINICAL IMPRESSION(S) / ED DIAGNOSES  Final diagnoses:  Viral illness  Nausea vomiting and diarrhea      NEW MEDICATIONS STARTED DURING THIS VISIT:  New Prescriptions   PROMETHAZINE (PHENERGAN) 25 MG TABLET    Take 1 tablet (25 mg total) by mouth every 6 (six) hours as needed for nausea or vomiting.         Hope PigeonJami L Hagler, PA-C 01/16/16 1932    Jennye MoccasinBrian S Quigley, MD 01/16/16 [phone]

## 2018-01-30 ENCOUNTER — Encounter: Payer: Self-pay | Admitting: Emergency Medicine

## 2018-01-30 ENCOUNTER — Emergency Department
Admission: EM | Admit: 2018-01-30 | Discharge: 2018-01-30 | Disposition: A | Discharge status: Home / Self Care | Payer: Self-pay | Attending: Emergency Medicine | Admitting: Emergency Medicine

## 2018-01-30 ENCOUNTER — Other Ambulatory Visit: Payer: Self-pay

## 2018-01-30 DIAGNOSIS — I10 Essential (primary) hypertension: Secondary | ICD-10-CM | POA: Insufficient documentation

## 2018-01-30 DIAGNOSIS — J069 Acute upper respiratory infection, unspecified: Secondary | ICD-10-CM | POA: Insufficient documentation

## 2018-01-30 DIAGNOSIS — F17200 Nicotine dependence, unspecified, uncomplicated: Secondary | ICD-10-CM | POA: Insufficient documentation

## 2018-01-30 DIAGNOSIS — B9789 Other viral agents as the cause of diseases classified elsewhere: Secondary | ICD-10-CM | POA: Insufficient documentation

## 2018-01-30 DIAGNOSIS — Z79899 Other long term (current) drug therapy: Secondary | ICD-10-CM | POA: Insufficient documentation

## 2018-01-30 LAB — INFLUENZA PANEL BY PCR (TYPE A & B)
Influenza A By PCR: NEGATIVE
Influenza B By PCR: NEGATIVE

## 2018-01-30 NOTE — Discharge Instructions (Addendum)
Follow-up with your primary care provider if any continued problems.  Continue taking your over-the-counter medication for cold and flu.  Increase fluids.  You may take Tylenol or ibuprofen as needed for fever, headache or body aches.

## 2018-01-30 NOTE — ED Provider Notes (Signed)
[facility] Emergency Department Provider Note  ____________________________________________   First MD Initiated Contact with Patient 01/30/18 1051     (approximate)  I have reviewed the triage vital signs and the nursing notes.   HISTORY  Chief Complaint Cough; Nasal Congestion; Generalized Body Aches; and Diarrhea  HPI Suzanne Williams is a 52 y.o. female presents to the ED with complaint of feeling sick for the last 3 days.  Patient states she has low-grade fever, body aches, and diarrhea.  She reports having diarrhea 3 times today.  She states diarrhea started yesterday.  She has continued to drink fluids.  She has taken over-the-counter "flu" preparations without any relief.  She does have family members who were diagnosed with flu.   Past Medical History:  Diagnosis Date  . Hypertension   . Thyroid disease     There are no active problems to display for this patient.   History reviewed. No pertinent surgical history.  Prior to Admission medications   Medication Sig Start Date End Date Taking? Authorizing Provider  fluticasone (FLONASE) 50 MCG/ACT nasal spray Place 2 sprays into both nostrils daily. 05/03/15   Hagler, Jami L, PA-C    Allergies Patient has no known allergies.  No family history on file.  Social History Social History   Tobacco Use  . Smoking status: Current Some Day Smoker  . Smokeless tobacco: Never Used  Substance Use Topics  . Alcohol use: No  . Drug use: Not on file    Review of Systems Constitutional: Positive fever/chills Eyes: No visual changes. ENT: No sore throat. Cardiovascular: Denies chest pain. Respiratory: Denies shortness of breath. Gastrointestinal: No abdominal pain.  No nausea, no vomiting.  Positive diarrhea.  No constipation. Genitourinary: Negative for dysuria. Musculoskeletal: Positive for mild muscle aches. Skin: Negative for rash. Neurological: Negative for headaches, focal weakness or  numbness. ___________________________________________   PHYSICAL EXAM:  VITAL SIGNS: ED Triage Vitals  Enc Vitals Group     BP 01/30/18 1045 (!) 142/102     Pulse Rate 01/30/18 1045 86     Resp 01/30/18 1045 16     Temp 01/30/18 1045 98.5 F (36.9 C)     Temp Source 01/30/18 1045 Oral     SpO2 01/30/18 1045 100 %     Weight 01/30/18 1046 210 lb (95.3 kg)     Height 01/30/18 1046 5\' 2"  (1.575 m)     Head Circumference --      Peak Flow --      Pain Score 01/30/18 1045 5     Pain Loc --      Pain Edu? --      Excl. in GC? --    Constitutional: Alert and oriented. Well appearing and in no acute distress. Eyes: Conjunctivae are normal.  Head: Atraumatic. Nose: Minimal congestion/rhinnorhea.  EACs and TMs are clear bilaterally. Mouth/Throat: Mucous membranes are moist.  Oropharynx non-erythematous. Neck: No stridor.   Hematological/Lymphatic/Immunilogical: No cervical lymphadenopathy. Cardiovascular: Normal rate, regular rhythm. Grossly normal heart sounds.  Good peripheral circulation. Respiratory: Normal respiratory effort.  No retractions. Lungs CTAB. Gastrointestinal: Soft and nontender. No distention.  Musculoskeletal: No lower extremity tenderness nor edema.  No joint effusions. Neurologic:  Normal speech and language. No gross focal neurologic deficits are appreciated. No gait instability. Skin:  Skin is warm, dry and intact. No rash noted. Psychiatric: Mood and affect are normal. Speech and behavior are normal.  ____________________________________________   LABS (all labs ordered are listed, but only  abnormal results are displayed)  Labs Reviewed  INFLUENZA PANEL BY PCR (TYPE A & B)   PROCEDURES  Procedure(s) performed: None  Procedures  Critical Care performed: No  ____________________________________________   INITIAL IMPRESSION / ASSESSMENT AND PLAN / ED COURSE  As part of my medical decision making, I reviewed the following data within the  electronic MEDICAL RECORD NUMBER Notes from prior ED visits and [place] Controlled Substance Database  Patient presents to the ED with complaint of cough and congestion for the last 3 days.  Patient complains of low-grade fever, body aches and diarrhea x3 today.  She is not aware of any exposure to influenza.  Patient is currently taking cold and flu over-the-counter medication.  Physical exam was unremarkable.  Influenza test was negative.  Patient was made aware and she is to continue with her over-the-counter medication as needed for symptoms along with Tylenol or ibuprofen if needed for fever.  ____________________________________________   FINAL CLINICAL IMPRESSION(S) / ED DIAGNOSES  Final diagnoses:  Viral URI with cough     ED Discharge Orders    None       Note:  This document was prepared using Dragon voice recognition software and may include unintentional dictation errors.    Tommi RumpsSummers, Rhonda L, PA-C 01/30/18 1454    Sharman CheekStafford, Phillip, MD 01/31/18 [phone]

## 2018-01-30 NOTE — ED Triage Notes (Signed)
Says sick for 3 days.  Body aches.  Fever-low grade.  Diarrhea (3 times today.) the diarrhea started yesterday.

## 2018-06-06 IMAGING — CR DG HAND COMPLETE 3+V*R*
1 series · 3 of 3 positions shown · non-contrast
Comparison: None.

CLINICAL DATA: Pain after fall on [REDACTED].  Swelling of the pinky.

EXAM:
RIGHT HAND - COMPLETE 3+ VIEW

[Series 1: dg hand complete right · 0.14mm/px · 3 of 3 slices shown]
[im 1/3]
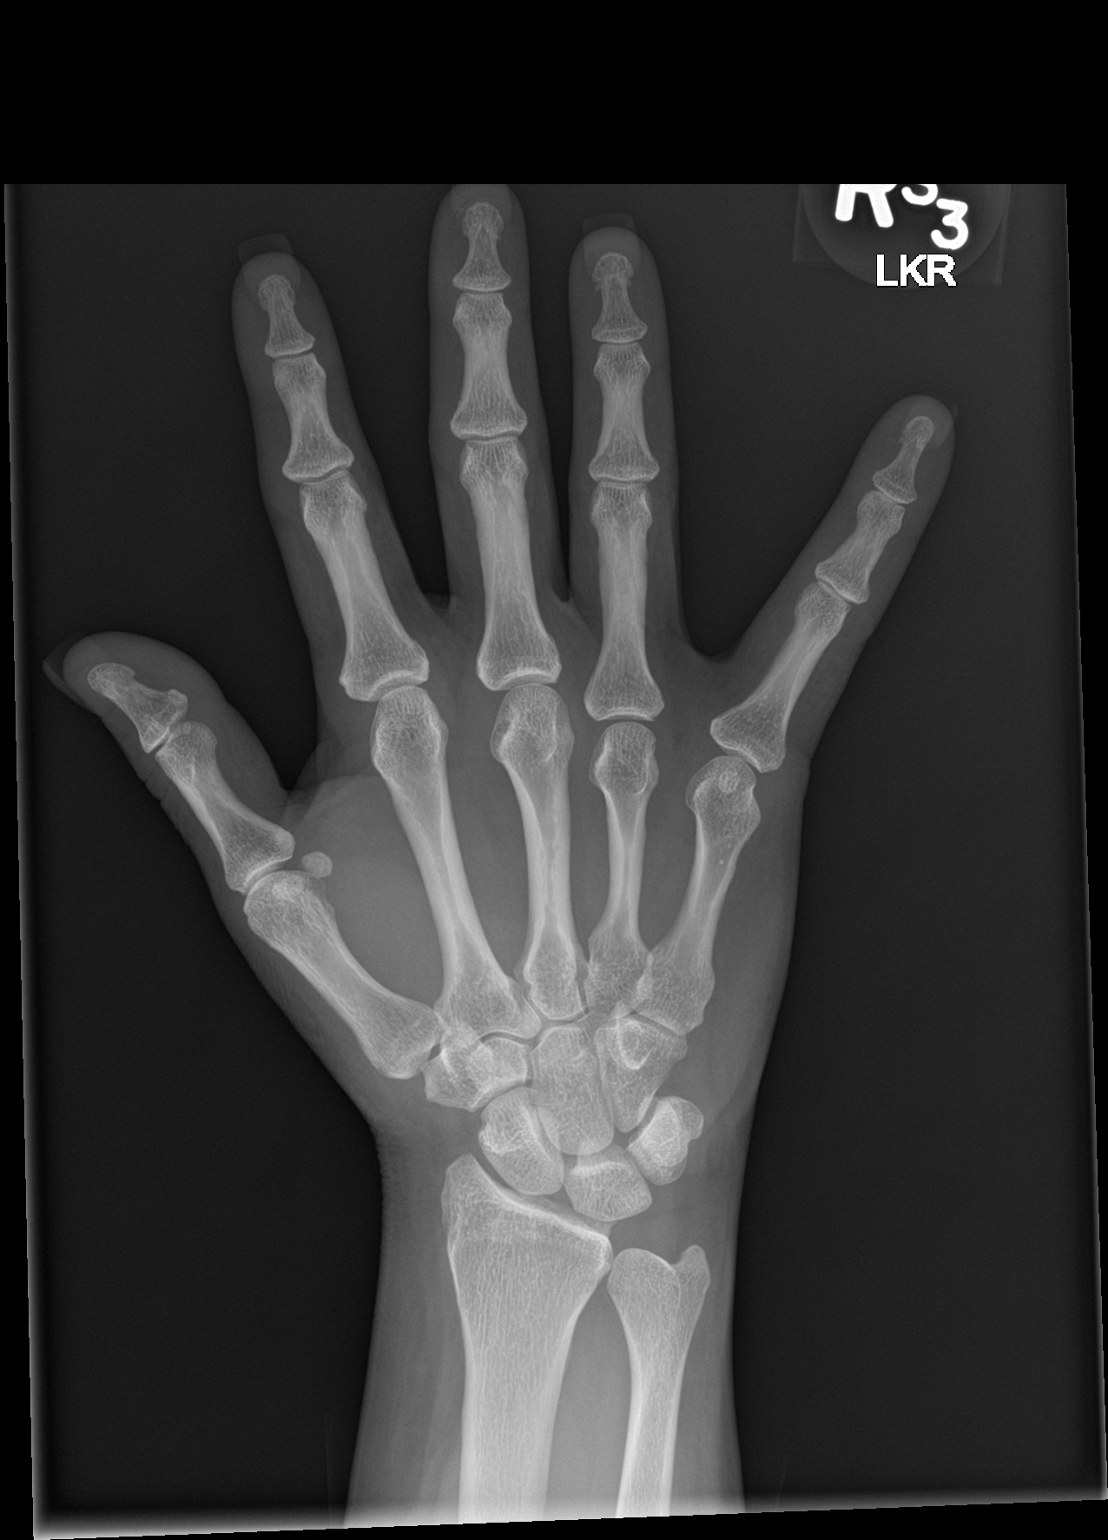
[im 2/3]
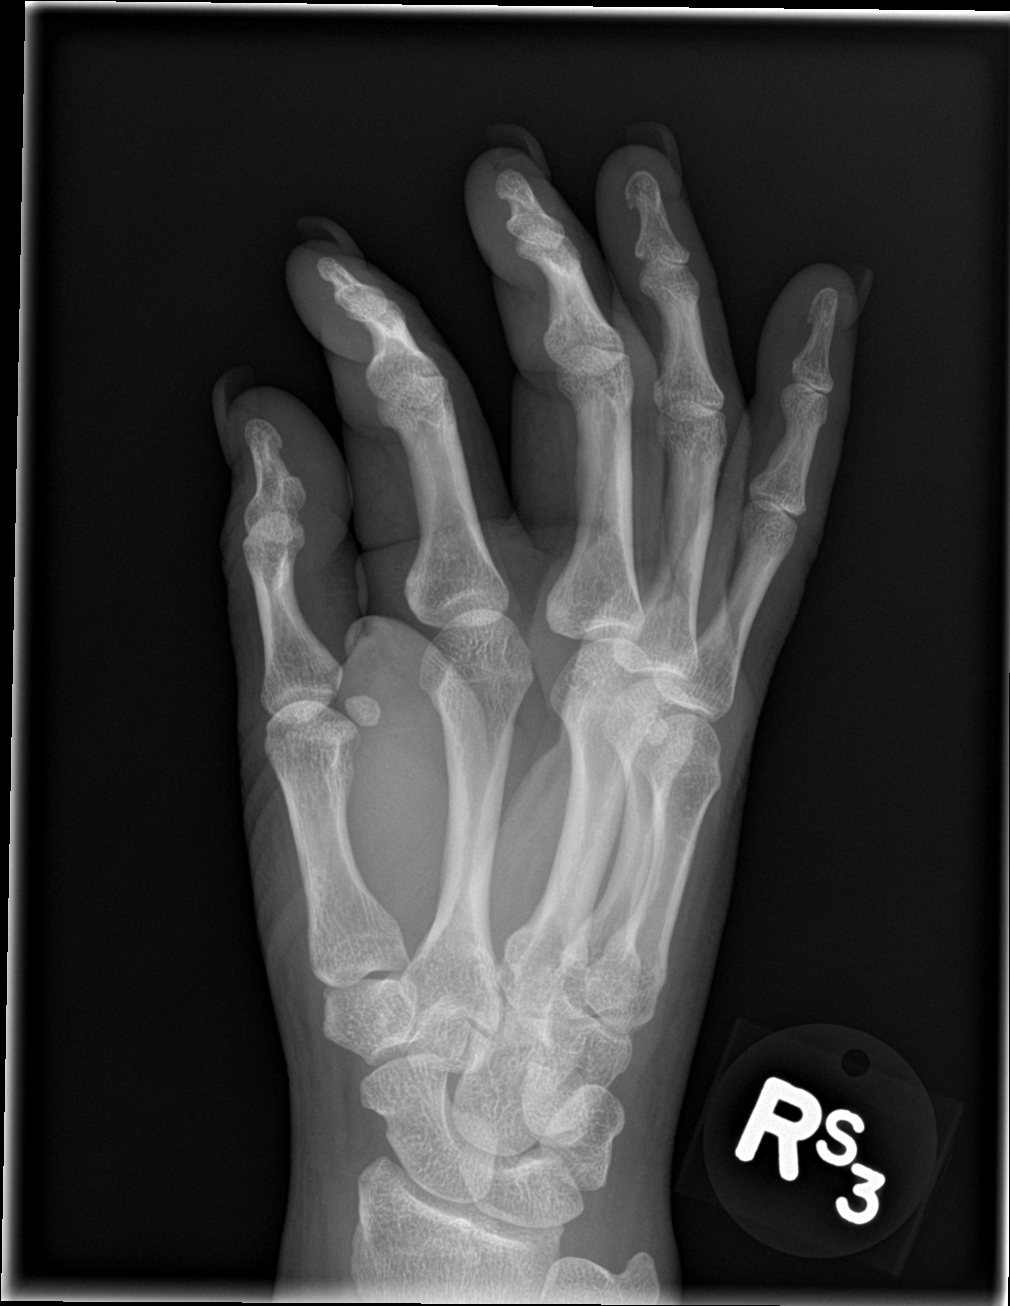
[im 3/3]
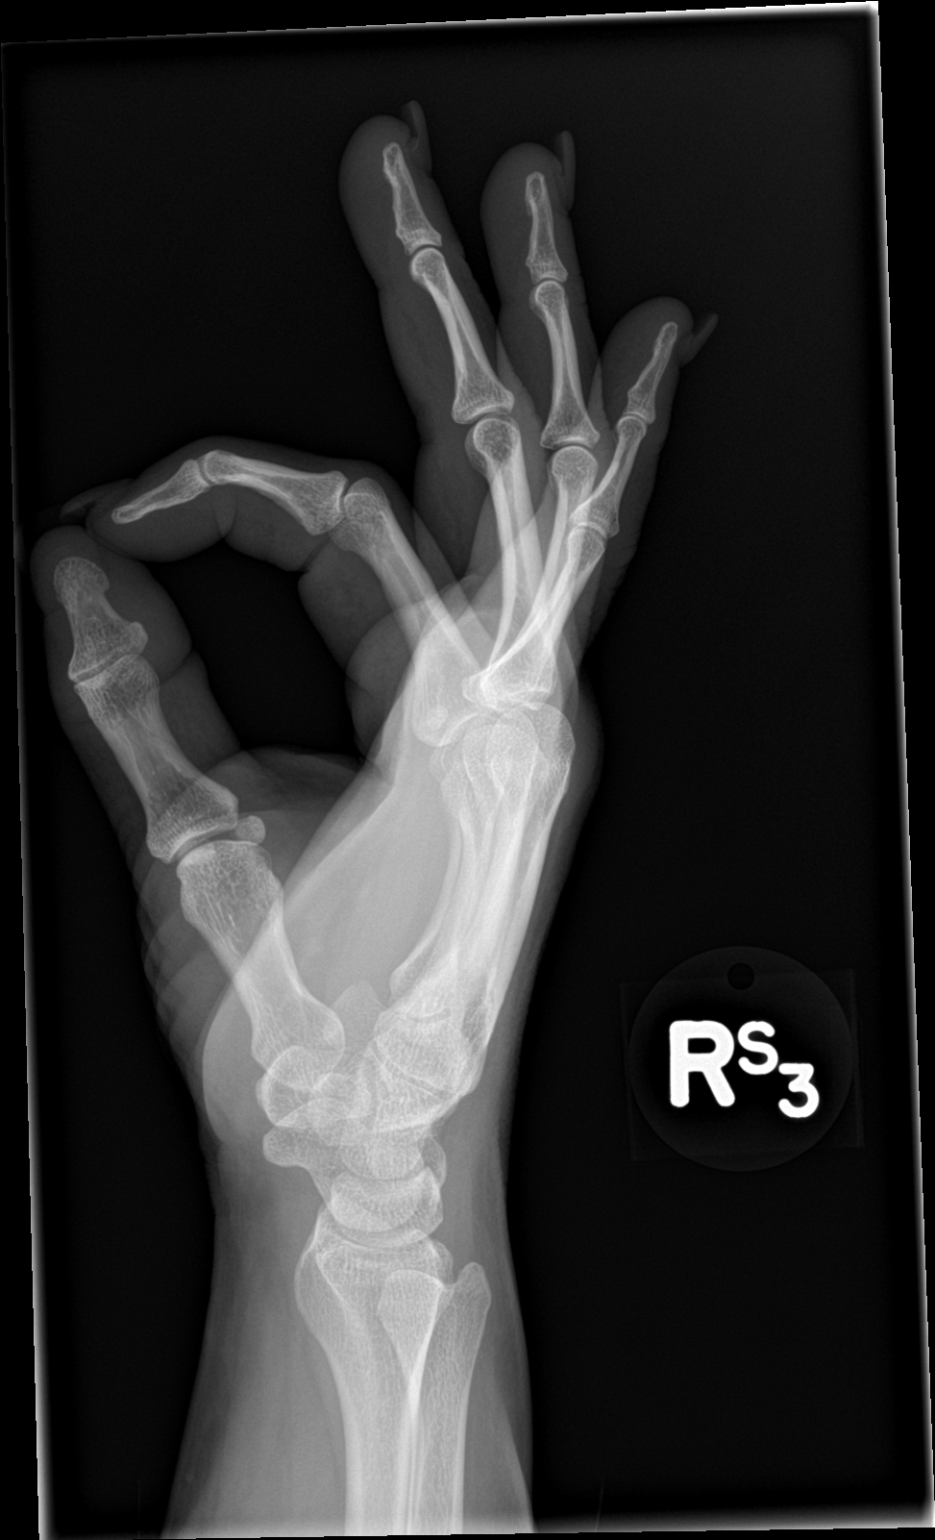

[3 of 3 positions shown; findings below may reference images not displayed]

FINDINGS: There is no evidence of fracture or dislocation. There is no
evidence of arthropathy or other focal bone abnormality. Soft
tissues are unremarkable.
IMPRESSION: Negative.

## 2018-06-06 IMAGING — CR DG WRIST COMPLETE 3+V*R*
1 series · 4 of 4 positions shown · non-contrast
Comparison: None.

CLINICAL DATA: Right wrist pain after fall.

EXAM:
RIGHT WRIST - COMPLETE 3+ VIEW

[Series 1: dg wrist complete right · 0.14mm/px · 4 of 4 slices shown]
[im 1/4]
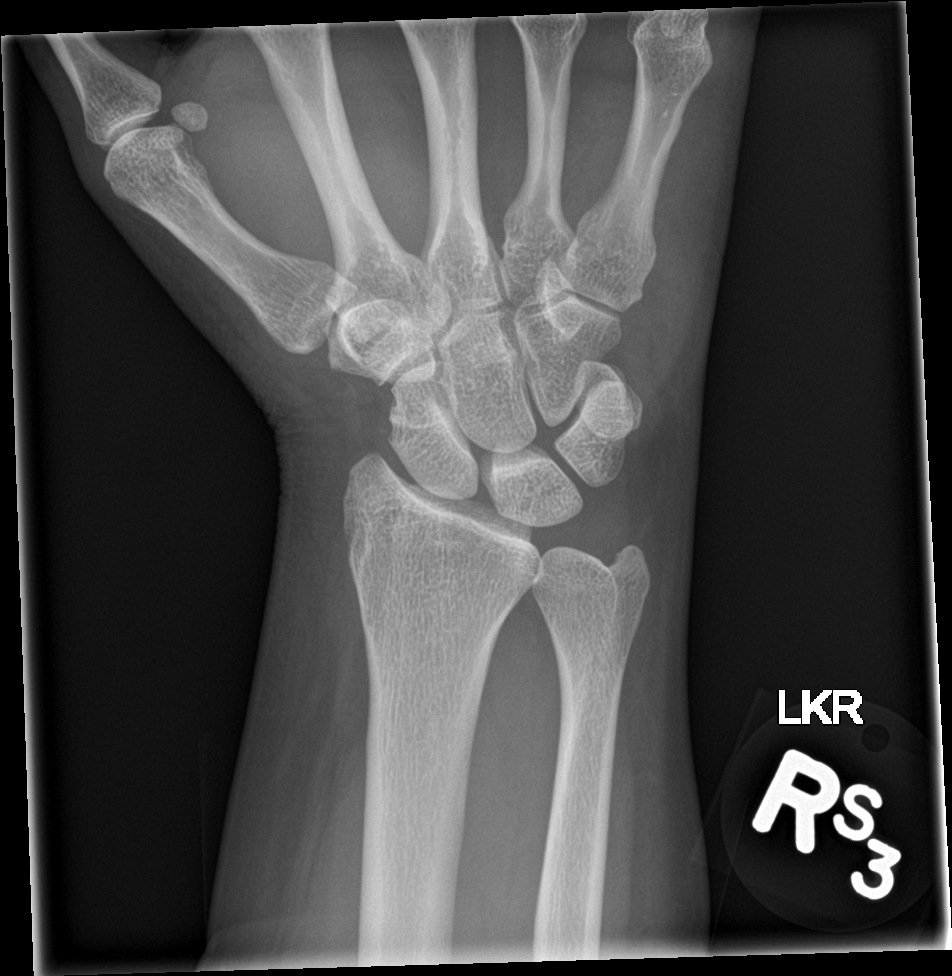
[im 2/4]
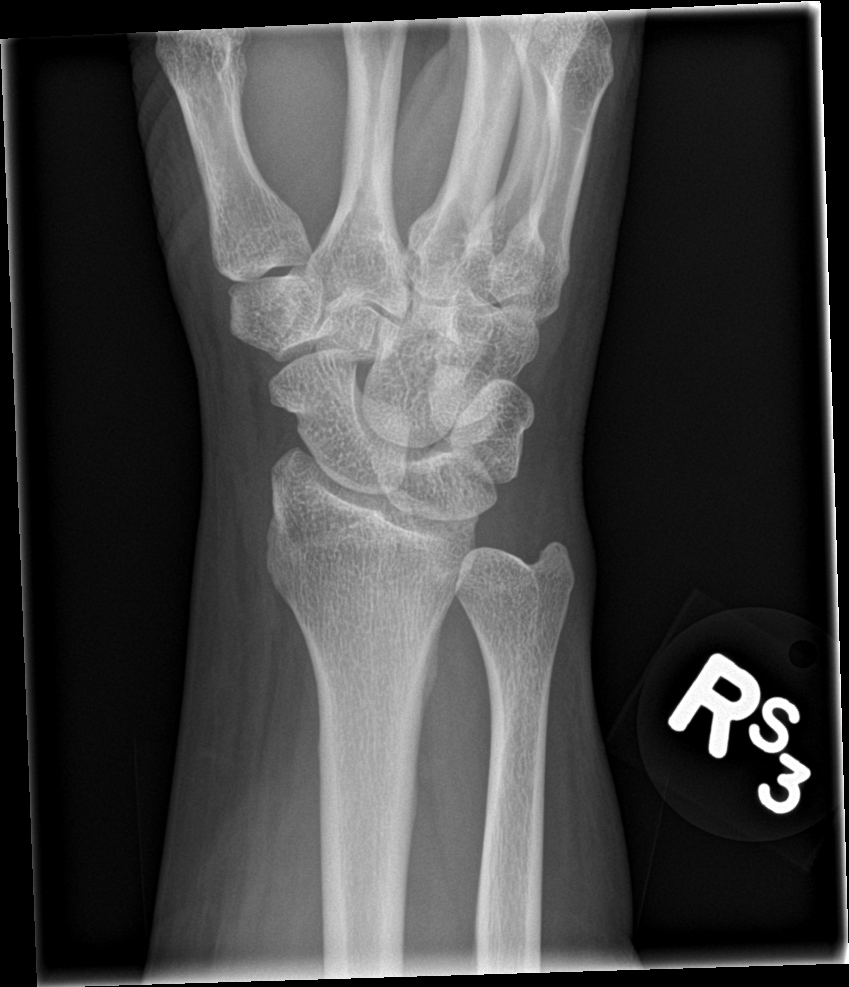
[im 3/4]
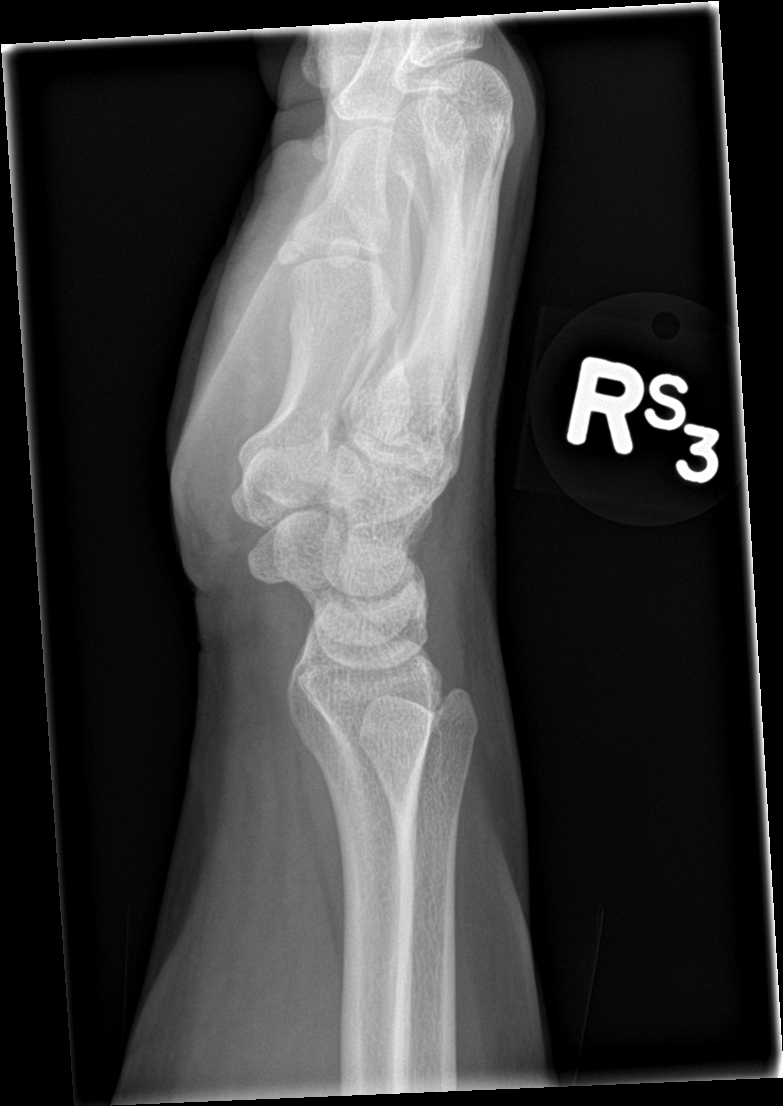
[im 4/4]
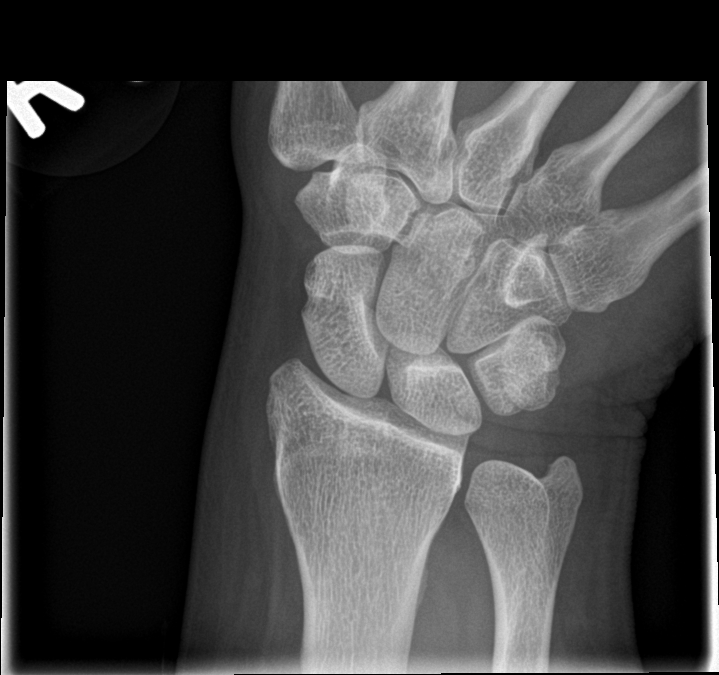

[4 of 4 positions shown; findings below may reference images not displayed]

FINDINGS: There is no evidence of fracture or dislocation. The carpal bones
are maintained as are the carpal rows. There is no evidence of
arthropathy or other focal bone abnormality. Soft tissues are
unremarkable.
IMPRESSION: Negative.

## 2018-08-17 ENCOUNTER — Other Ambulatory Visit: Payer: Self-pay | Admitting: Physician Assistant

## 2018-08-17 DIAGNOSIS — Z1231 Encounter for screening mammogram for malignant neoplasm of breast: Secondary | ICD-10-CM

## 2018-09-05 ENCOUNTER — Encounter: Payer: Self-pay | Admitting: *Deleted

## 2019-03-26 ENCOUNTER — Ambulatory Visit: Payer: Self-pay

## 2019-06-26 ENCOUNTER — Other Ambulatory Visit: Payer: Self-pay

## 2019-06-26 ENCOUNTER — Emergency Department: Payer: BLUE CROSS/BLUE SHIELD

## 2019-06-26 ENCOUNTER — Emergency Department
Admission: EM | Admit: 2019-06-26 | Discharge: 2019-06-26 | Disposition: A | Discharge status: Home / Self Care | Payer: BLUE CROSS/BLUE SHIELD | Attending: Emergency Medicine | Admitting: Emergency Medicine

## 2019-06-26 DIAGNOSIS — F17200 Nicotine dependence, unspecified, uncomplicated: Secondary | ICD-10-CM | POA: Insufficient documentation

## 2019-06-26 DIAGNOSIS — M5441 Lumbago with sciatica, right side: Secondary | ICD-10-CM | POA: Insufficient documentation

## 2019-06-26 DIAGNOSIS — M79604 Pain in right leg: Secondary | ICD-10-CM | POA: Diagnosis present

## 2019-06-26 DIAGNOSIS — I10 Essential (primary) hypertension: Secondary | ICD-10-CM | POA: Diagnosis not present

## 2019-06-26 MED ORDER — TRAMADOL HCL 50 MG PO TABS
50.0000 mg | ORAL_TABLET | Freq: Once | ORAL | Status: AC
  Administered 2019-06-26: 50 mg via ORAL
  Filled 2019-06-26: qty 1

## 2019-06-26 MED ORDER — TIZANIDINE HCL 4 MG PO TABS: 4.0000 mg | ORAL_TABLET | Freq: Three times a day (TID) | ORAL | 0 refills | Status: DC

## 2019-06-26 MED ORDER — DEXAMETHASONE SODIUM PHOSPHATE 10 MG/ML IJ SOLN
10.0000 mg | Freq: Once | INTRAMUSCULAR | Status: AC
  Administered 2019-06-26: 10 mg via INTRAMUSCULAR
  Filled 2019-06-26: qty 1

## 2019-06-26 MED ORDER — MELOXICAM 15 MG PO TABS: 15.0000 mg | ORAL_TABLET | Freq: Every day | ORAL | 0 refills | Status: DC

## 2019-06-26 MED ORDER — OXYCODONE-ACETAMINOPHEN 5-325 MG PO TABS: 1.0000 | ORAL_TABLET | Freq: Four times a day (QID) | ORAL | 0 refills | Status: DC | PRN

## 2019-06-26 NOTE — ED Triage Notes (Signed)
Pt arrives via POV from home for reports of right leg pain that starts in her ankle and goes up her whole leg x 3 week. Pt reports after walking at work her bottom and back will start hurting. Pt reports she was prescribed prednisone and a muscle relaxer but it did not help. Pt in NAD at this time.

## 2019-06-26 NOTE — ED Notes (Signed)
See triage note- Pt here with right knee pain/ leg pain. Pt states it is very difficult to walk. Pain starts in hip and radiates down entire leg, denies hx of the same. Denies injury.

## 2019-06-26 NOTE — ED Provider Notes (Signed)
[facility] Emergency Department Provider Note ____________________________________________   First MD Initiated Contact with Patient 06/26/19 1307     (approximate)  I have reviewed the triage vital signs and the nursing notes.   HISTORY  Chief Complaint Leg Pain  HPI Suzanne Williams is a 53 y.o. female who presents to the emergency department for treatment and evaluation of right lower extremity pain.  She states that her primary care provider diagnosed her with sciatica and put her on prednisone and a muscle relaxer.  She completed the course of prednisone as well as taking all the muscle relaxers without relief.  She had a telehealth visit with her primary care provider today who prescribed a second round of prednisone.  The patient does not feel like this is the solution to her issue and came here for further work-up.  She does have some increasing pain in her right calf and ankle.  No significant swelling.  No history of DVT.  No shortness of breath.  No chest pain.         Past Medical History:  Diagnosis Date  . Hypertension   . Thyroid disease     There are no problems to display for this patient.   History reviewed. No pertinent surgical history.  Prior to Admission medications   Medication Sig Start Date End Date Taking? Authorizing Provider  fluticasone (FLONASE) 50 MCG/ACT nasal spray Place 2 sprays into both nostrils daily. 05/03/15   Hagler, Jami L, PA-C  meloxicam (MOBIC) 15 MG tablet Take 1 tablet (15 mg total) by mouth daily. 06/26/19   Triplett, Rulon Eisenmenger B, FNP  oxyCODONE-acetaminophen (PERCOCET) 5-325 MG tablet Take 1 tablet by mouth every 6 (six) hours as needed. 06/26/19 06/25/20  Triplett, Kasandra Knudsen, FNP  tiZANidine (ZANAFLEX) 4 MG tablet Take 1 tablet (4 mg total) by mouth 3 (three) times daily. 06/26/19   Chinita Pester, FNP    Allergies Patient has no known allergies.  History reviewed. No pertinent family history.  Social History  Social History   Tobacco Use  . Smoking status: Current Some Day Smoker  . Smokeless tobacco: Never Used  Substance Use Topics  . Alcohol use: No  . Drug use: Not on file    Review of Systems  Constitutional: No fever/chills Eyes: No visual changes. ENT: No sore throat. Cardiovascular: Denies chest pain. Respiratory: Denies shortness of breath. Gastrointestinal: No abdominal pain.  No nausea, no vomiting.  No diarrhea.  No constipation. Genitourinary: Negative for dysuria. Musculoskeletal: Positive for back pain. Skin: Negative for rash. Neurological: Negative for headaches, focal weakness or numbness. ____________________________________________   PHYSICAL EXAM:  VITAL SIGNS: ED Triage Vitals  Enc Vitals Group     BP 06/26/19 1215 (!) 156/85     Pulse Rate 06/26/19 1215 89     Resp 06/26/19 1215 18     Temp 06/26/19 1215 98.6 F (37 C)     Temp Source 06/26/19 1215 Oral     SpO2 06/26/19 1215 99 %     Weight 06/26/19 1216 230 lb (104.3 kg)     Height 06/26/19 1216 5\' 1"  (1.549 m)     Head Circumference --      Peak Flow --      Pain Score 06/26/19 1221 8     Pain Loc --      Pain Edu? --      Excl. in [place]? --     Constitutional: Alert and oriented. Well appearing and in  no acute distress. Eyes: Conjunctivae are normal. PERRL. EOMI. Head: Atraumatic. Nose: No congestion/rhinnorhea. Mouth/Throat: Mucous membranes are moist.  Oropharynx non-erythematous. Neck: No stridor.   Hematological/Lymphatic/Immunilogical: No cervical lymphadenopathy. Cardiovascular: Normal rate, regular rhythm. Grossly normal heart sounds.  Good peripheral circulation. Respiratory: Normal respiratory effort.  No retractions. Lungs CTAB. Gastrointestinal: Soft and nontender. No distention. No abdominal bruits. No CVA tenderness. Genitourinary:  Musculoskeletal: Right lower back pain with radiation into right leg and foot. Tenderness in right calf and ankle. No obvious swelling.  Neurologic:  Normal speech and language. No gross focal neurologic deficits are appreciated. No gait instability. Skin:  Skin is warm, dry and intact. No rash noted. Psychiatric: Mood and affect are normal. Speech and behavior are normal.  ____________________________________________   LABS (all labs ordered are listed, but only abnormal results are displayed)  Labs Reviewed - No data to display ____________________________________________  EKG  Not indicated. ____________________________________________  RADIOLOGY  ED MD interpretation:    Ultrasound of the right lower extremity is negative for acute DVT.  Official radiology report(s): US Venous Img Lower Unilateral Right  Result Date: 06/26/2019 CLINICAL DATA:  54 year old presenting with acute RIGHT calf pain and RIGHT ankle pain. EXAM: RIGHT LOWER EXTREMITY VENOUS DOPPLER ULTRASOUND TECHNIQUE: Gray-scale sonography with compression, as well as color and duplex ultrasound, were performed to evaluate the deep venous system(s) from the level of the common femoral vein through the popliteal and proximal calf veins. COMPARISON:  None. FINDINGS: VENOUS Normal compressibility of the common femoral, superficial femoral, and popliteal veins, as well as the visualized calf veins. Visualized portions of profunda femoral vein and great saphenous vein unremarkable. No filling defects to suggest DVT on grayscale or color Doppler imaging. Doppler waveforms show normal direction of venous flow, normal respiratory plasticity and response to augmentation. The contralateral common femoral vein is widely patent with normal compressibility and normal respiratory phasicity. OTHER None. Limitations: None. IMPRESSION: No evidence of RIGHT lower extremity DVT. Electronically Signed   By: Hulan Saas M.D.   On: 06/26/2019 14:02    ____________________________________________   PROCEDURES  Procedure(s) performed (including Critical Care):   Procedures  ____________________________________________   INITIAL IMPRESSION / ASSESSMENT AND PLAN     54 year old female presenting to the emergency department for treatment and evaluation of back pain that radiates into the right lower extremity.  She has also noticed some increasing tenderness in the right calf and ankle over the past day or so.  See HPI for further details.  Although symptoms are most likely secondary to sciatica, due to the increase in pain in the calf ultrasound to rule out DVT will be ordered.  DIFFERENTIAL DIAGNOSIS  Sciatica.  DVT.  Musculoskeletal strain.  ED COURSE  Ultrasound is reassuring.  No obvious DVT.  Patient was given an injection of Decadron while here in the emergency department as well as a tramadol.  She will be advised to stop taking the prednisone and will be prescribed Zanaflex, meloxicam, and a short course of Percocet.  She is to call and schedule a follow-up appointment with her primary care provider or try and get in with physiatry.  She was advised to return to the emergency department for symptoms of change or worsen if she is unable to schedule an appointment. ____________________________________________   FINAL CLINICAL IMPRESSION(S) / ED DIAGNOSES  Final diagnoses:  Acute back pain with sciatica, right     ED Discharge Orders         Ordered    oxyCODONE-acetaminophen (  PERCOCET) 5-325 MG tablet  Every 6 hours PRN     06/26/19 1521    meloxicam (MOBIC) 15 MG tablet  Daily     06/26/19 1521    tiZANidine (ZANAFLEX) 4 MG tablet  3 times daily     06/26/19 Suzanne Williams was evaluated in Emergency Department on 06/26/2019 for the symptoms described in the history of present illness. She was evaluated in the context of the global COVID-19 pandemic, which necessitated consideration that the patient might be at risk for infection with the SARS-CoV-2 virus that causes COVID-19. Institutional protocols and algorithms  that pertain to the evaluation of patients at risk for COVID-19 are in a state of rapid change based on information released by regulatory bodies including the CDC and federal and state organizations. These policies and algorithms were followed during the patient's care in the ED.   Note:  This document was prepared using Dragon voice recognition software and may include unintentional dictation errors.   Victorino Dike, FNP 06/26/19 1719    Duffy Bruce, MD 06/27/19 2031

## 2019-07-04 ENCOUNTER — Other Ambulatory Visit: Payer: Self-pay | Admitting: Physician Assistant

## 2019-07-04 DIAGNOSIS — M5431 Sciatica, right side: Secondary | ICD-10-CM

## 2019-07-18 ENCOUNTER — Other Ambulatory Visit: Payer: Self-pay

## 2019-07-18 ENCOUNTER — Emergency Department: Payer: BLUE CROSS/BLUE SHIELD

## 2019-07-18 ENCOUNTER — Emergency Department
Admission: EM | Admit: 2019-07-18 | Discharge: 2019-07-18 | Disposition: A | Discharge status: Home / Self Care | Payer: BLUE CROSS/BLUE SHIELD | Attending: Emergency Medicine | Admitting: Emergency Medicine

## 2019-07-18 DIAGNOSIS — M5441 Lumbago with sciatica, right side: Secondary | ICD-10-CM | POA: Insufficient documentation

## 2019-07-18 DIAGNOSIS — E079 Disorder of thyroid, unspecified: Secondary | ICD-10-CM | POA: Insufficient documentation

## 2019-07-18 DIAGNOSIS — Z720 Tobacco use: Secondary | ICD-10-CM | POA: Diagnosis not present

## 2019-07-18 DIAGNOSIS — M25561 Pain in right knee: Secondary | ICD-10-CM | POA: Diagnosis not present

## 2019-07-18 DIAGNOSIS — I10 Essential (primary) hypertension: Secondary | ICD-10-CM | POA: Diagnosis not present

## 2019-07-18 DIAGNOSIS — G8929 Other chronic pain: Secondary | ICD-10-CM | POA: Insufficient documentation

## 2019-07-18 DIAGNOSIS — M545 Low back pain: Secondary | ICD-10-CM | POA: Diagnosis present

## 2019-07-18 DIAGNOSIS — G8921 Chronic pain due to trauma: Secondary | ICD-10-CM | POA: Diagnosis not present

## 2019-07-18 MED ORDER — MELOXICAM 15 MG PO TABS: 15.0000 mg | ORAL_TABLET | Freq: Every day | ORAL | 0 refills | Status: AC

## 2019-07-18 MED ORDER — CYCLOBENZAPRINE HCL 10 MG PO TABS
10.0000 mg | ORAL_TABLET | Freq: Once | ORAL | Status: AC
  Administered 2019-07-18: 10 mg via ORAL
  Filled 2019-07-18: qty 1

## 2019-07-18 MED ORDER — DEXAMETHASONE SODIUM PHOSPHATE 10 MG/ML IJ SOLN
10.0000 mg | Freq: Once | INTRAMUSCULAR | Status: AC
  Administered 2019-07-18: 10 mg via INTRAMUSCULAR
  Filled 2019-07-18: qty 1

## 2019-07-18 MED ORDER — CYCLOBENZAPRINE HCL 10 MG PO TABS: 10.0000 mg | ORAL_TABLET | Freq: Three times a day (TID) | ORAL | 0 refills | Status: AC | PRN

## 2019-07-18 NOTE — ED Provider Notes (Signed)
[facility] Emergency Department Provider Note ____________________________________________  Time seen: 1650  I have reviewed the triage vital signs and the nursing notes.  HISTORY  Chief Complaint  Back Pain   HPI Suzanne Williams is a 54 y.o. female presents to the ER today with complaint of acute on chronic low back pain.  She reports this started 2 months ago as a result of a work injury.  She describes the pain as sore and achy but can be sharp and stabbing with certain movements.  She reports the pain radiates from her right lower back into the toes of her right foot.  She denies any numbness or tingling but has noticed some weakness in the right lower extremity, and has started using a cane because of this.  She denies loss of bowel or bladder control.  She was seen at [facility] urgent care for this workman's comp claim.  She reports she is currently on her third round of prednisone, which has helped in the past but does not seem to be helping at this point.  She was seen at the ER 5/17 for the same, prescribed Meloxicam, Zanaflex and Percocet which she reports did help but she has run out of these medications.  She reports [facility] has set her up with PT for next week.  If that does not work she will need an MRI.  She reports she has not had an x-ray of her back.  She is currently taking Tylenol and Ibuprofen with minimal relief.  She reports she has to go back to work tomorrow and really needs to for financial reasons, but does not feel like she will be able to if this pain does not decrease.  Past Medical History:  Diagnosis Date  . Hypertension   . Thyroid disease     There are no problems to display for this patient.   History reviewed. No pertinent surgical history.  Prior to Admission medications   Medication Sig Start Date End Date Taking? Authorizing Provider  cyclobenzaprine (FLEXERIL) 10 MG tablet Take 1 tablet (10 mg total) by mouth 3 (three) times  daily as needed for muscle spasms. 07/18/19   Lorre Munroe, NP  fluticasone (FLONASE) 50 MCG/ACT nasal spray Place 2 sprays into both nostrils daily. 05/03/15   Hagler, Jami L, PA-C  meloxicam (MOBIC) 15 MG tablet Take 1 tablet (15 mg total) by mouth daily. 07/18/19   Lorre Munroe, NP    Allergies Patient has no known allergies.  No family history on file.  Social History Social History   Tobacco Use  . Smoking status: Current Some Day Smoker  . Smokeless tobacco: Never Used  Substance Use Topics  . Alcohol use: No  . Drug use: Not on file    Review of Systems  Constitutional: Negative for fever, chills or body aches. Cardiovascular: Negative for chest pain or chest tightness. Respiratory: Negative for cough or shortness of breath. Gastrointestinal: Negative for loss of bowel control. Genitourinary: Negative for loss of bladder control. Musculoskeletal: Positive for right knee pain, back pain.  Negative for hip, or ankle pain of the RLE. Neurological: Positive for weakness of the RLE.  Negative for headaches, tingling or numbness. ____________________________________________  PHYSICAL EXAM:  VITAL SIGNS: ED Triage Vitals  Enc Vitals Group     BP 07/18/19 1503 (!) 156/98     Pulse Rate 07/18/19 1503 98     Resp 07/18/19 1503 20     Temp 07/18/19 1503 98.4 F (  36.9 C)     Temp Source 07/18/19 1503 Oral     SpO2 07/18/19 1503 97 %     Weight 07/18/19 1501 230 lb (104.3 kg)     Height 07/18/19 1501 5\' 1"  (1.549 m)     Head Circumference --      Peak Flow --      Pain Score 07/18/19 1501 10     Pain Loc --      Pain Edu? --      Excl. in [place]? --     Constitutional: Alert and oriented.  Obese, in no distress. Cardiovascular: Normal rate, regular rhythm.  Pedal pulses 2+ bilaterally Respiratory: Normal respiratory effort. No wheezes/rales/rhonchi. Musculoskeletal: Normal flexion and lateral bending of the spine.  Normal but painful extension and rotation of the  spin  No bony tenderness noted over the lumbar spine.  No tenderness with palpation of the bilateral paralumbar muscles. Normal flexion, extension of the right knee. No pain with palpation of the right knee.  Able to stand on heels and toes.  Strength 4/5 RLE.  Strength 5/5 LLE.  Gait slow and steady with use of cane. Neurologic:  Normal speech and language.  Positive SLR at 30 degrees on the right. Skin:  Skin is warm, dry and intact. No rash noted.  _____________________________________________   RADIOLOGY  Imaging Orders     DG Lumbar Spine Complete     DG Knee Complete 4 Views Right   IMPRESSION: No acute abnormality.  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   IMPRESSION: 1. No evidence of acute abnormality. 2. Mild degenerative changes in the MEDIAL and patellofemoral compartments.   ____________________________________________   INITIAL IMPRESSION / ASSESSMENT AND PLAN / ED COURSE  Acute on Chronic Low Back Pain with Right Sided Sciatica:  Xray lumbar spine shows arthritic changes Xray right knee shows arthritic changes Decadron 10 mg IM x 1 Flexeril 10 mg PO x 1 RX for Meloxicam 15 mg PO x 2 weeks RX for Flexeril 10 mg TID prn x 5 days Start PT next week as ordered Follow up with [facility] for ongoing workman comp claim     I reviewed the patient's prescription history over the last 12 months in the multi-state controlled substances database(s) that includes [place], [place], [place], [place], [place], [place], [place], [place], [place], [place], [place], [place], [place], and [place].  Results were notable for Percocet #12 06/26/19 ____________________________________________  FINAL CLINICAL IMPRESSION(S) / ED DIAGNOSES  Final diagnoses:  Chronic midline low back pain with right-sided sciatica      Jearld Fenton, NP 07/18/19 Dionicia Abler    Nance Pear, MD 07/18/19 [phone]

## 2019-07-18 NOTE — Discharge Instructions (Addendum)
You were seen today for acute on chronic low back pain with right-sided sciatica.  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  You received a steroid injection today.  Please stop your oral prednisone as it has not been effective.  I am given you prescription for meloxicam and muscle relaxers.  Please take as prescribed.  Please avoid other NSAIDs OTC.  Please follow-up with [facility] for ongoing treatment of your Workmen's Comp. claim.  Please keep your physical therapy appointments as previously scheduled.

## 2019-07-18 NOTE — ED Triage Notes (Signed)
Pt comes via POV from home with c/o lower back pain. Pt states this has been going on for few weeks. Pt states she thinks it is her sciatic nerve  Pt denies any recent injury

## 2019-08-02 ENCOUNTER — Other Ambulatory Visit: Payer: Self-pay

## 2019-08-02 ENCOUNTER — Ambulatory Visit (LOCAL_COMMUNITY_HEALTH_CENTER): Payer: Self-pay

## 2019-08-02 DIAGNOSIS — Z111 Encounter for screening for respiratory tuberculosis: Secondary | ICD-10-CM

## 2019-08-05 ENCOUNTER — Ambulatory Visit (LOCAL_COMMUNITY_HEALTH_CENTER): Payer: Self-pay

## 2019-08-05 ENCOUNTER — Other Ambulatory Visit: Payer: Self-pay

## 2019-08-05 DIAGNOSIS — Z111 Encounter for screening for respiratory tuberculosis: Secondary | ICD-10-CM

## 2019-08-05 LAB — TB SKIN TEST
Induration: 0 mm
TB Skin Test: NEGATIVE

## 2020-03-24 ENCOUNTER — Ambulatory Visit: Payer: BLUE CROSS/BLUE SHIELD

## 2020-03-27 ENCOUNTER — Ambulatory Visit (LOCAL_COMMUNITY_HEALTH_CENTER): Payer: Self-pay

## 2020-03-27 ENCOUNTER — Other Ambulatory Visit: Payer: Self-pay

## 2020-03-27 DIAGNOSIS — Z23 Encounter for immunization: Secondary | ICD-10-CM

## 2021-11-25 ENCOUNTER — Other Ambulatory Visit: Payer: Self-pay | Admitting: Internal Medicine

## 2021-11-25 DIAGNOSIS — N1832 Chronic kidney disease, stage 3b: Secondary | ICD-10-CM

## 2021-11-30 ENCOUNTER — Emergency Department
Admission: EM | Admit: 2021-11-30 | Discharge: 2021-11-30 | Disposition: A | Discharge status: Home / Self Care | Payer: BLUE CROSS/BLUE SHIELD | Attending: Emergency Medicine | Admitting: Emergency Medicine

## 2021-11-30 ENCOUNTER — Emergency Department: Payer: BLUE CROSS/BLUE SHIELD

## 2021-11-30 ENCOUNTER — Other Ambulatory Visit: Payer: Self-pay

## 2021-11-30 ENCOUNTER — Encounter: Payer: Self-pay | Admitting: Emergency Medicine

## 2021-11-30 DIAGNOSIS — R600 Localized edema: Secondary | ICD-10-CM | POA: Diagnosis present

## 2021-11-30 DIAGNOSIS — R609 Edema, unspecified: Secondary | ICD-10-CM

## 2021-11-30 DIAGNOSIS — M79662 Pain in left lower leg: Secondary | ICD-10-CM | POA: Insufficient documentation

## 2021-11-30 DIAGNOSIS — M79661 Pain in right lower leg: Secondary | ICD-10-CM | POA: Insufficient documentation

## 2021-11-30 DIAGNOSIS — R0602 Shortness of breath: Secondary | ICD-10-CM | POA: Insufficient documentation

## 2021-11-30 DIAGNOSIS — R944 Abnormal results of kidney function studies: Secondary | ICD-10-CM | POA: Insufficient documentation

## 2021-11-30 LAB — BASIC METABOLIC PANEL WITH GFR
Anion gap: 7 (ref 5–15)
BUN: 21 mg/dL — ABNORMAL HIGH (ref 6–20)
CO2: 21 mmol/L — ABNORMAL LOW (ref 22–32)
Calcium: 9.6 mg/dL (ref 8.9–10.3)
Chloride: 116 mmol/L — ABNORMAL HIGH (ref 98–111)
Creatinine, Ser: 0.75 mg/dL (ref 0.44–1.00)
GFR, Estimated: >60 mL/min
Glucose, Bld: 86 mg/dL (ref 70–99)
Potassium: 3.6 mmol/L (ref 3.5–5.1)
Sodium: 144 mmol/L (ref 135–145)

## 2021-11-30 LAB — CBC
HCT: 35.1 % — ABNORMAL LOW (ref 36.0–46.0)
Hemoglobin: 11.2 g/dL — ABNORMAL LOW (ref 12.0–15.0)
MCH: 29.9 pg (ref 26.0–34.0)
MCHC: 31.9 g/dL (ref 30.0–36.0)
MCV: 93.6 fL (ref 80.0–100.0)
Platelets: 219 K/uL (ref 150–400)
RBC: 3.75 MIL/uL — ABNORMAL LOW (ref 3.87–5.11)
RDW: 14.4 % (ref 11.5–15.5)
WBC: 4.8 K/uL (ref 4.0–10.5)
nRBC: 0 % (ref 0.0–0.2)

## 2021-11-30 LAB — BRAIN NATRIURETIC PEPTIDE: B Natriuretic Peptide: 215.1 pg/mL — ABNORMAL HIGH (ref 0.0–100.0)

## 2021-11-30 LAB — TROPONIN I (HIGH SENSITIVITY)
Troponin I (High Sensitivity): 6 ng/L
Troponin I (High Sensitivity): 8 ng/L (ref <18)

## 2021-11-30 MED ORDER — FUROSEMIDE 40 MG PO TABS
40.0000 mg | ORAL_TABLET | Freq: Once | ORAL | Status: AC
  Administered 2021-11-30: 40 mg via ORAL
  Filled 2021-11-30: qty 1

## 2021-11-30 MED ORDER — POTASSIUM CHLORIDE CRYS ER 20 MEQ PO TBCR: 20.0000 meq | EXTENDED_RELEASE_TABLET | Freq: Every day | ORAL | 0 refills | Status: AC

## 2021-11-30 MED ORDER — FUROSEMIDE 40 MG PO TABS: 40.0000 mg | ORAL_TABLET | Freq: Every day | ORAL | 0 refills | Status: AC

## 2021-11-30 NOTE — ED Provider Notes (Signed)
[facility] Provider Note  Patient Contact: 4:45 PM (approximate)   History   Leg Swelling and Shortness of Breath   HPI  Suzanne Williams is a 56 y.o. female  who presents with a month history of exertional shortness of breath and lower extremity edema.  Patient with no history of congestive heart failure.  She has no chest pain associated with her shortness of breath.  Patient states that she recently established with a primary care, her labs were overall good with the exception that she had what appears to be stage III kidney disease.  No history of same either.  She is scheduled for an ultrasound of her kidneys.  Patient states that over the last week the lower extremity edema has been worse associated with bilateral calf pain.  No history of DVT.  Patient took some Lasix from a friend and states that this has improved her lower extremity edema.  Patient currently has no chest pain.  No fevers or chills.  No GI symptoms.  No urinary complaints.      Physical Exam   Triage Vital Signs: ED Triage Vitals  Enc Vitals Group     BP 11/30/21 1442 (!) 140/77     Pulse Rate 11/30/21 1442 69     Resp 11/30/21 1442 20     Temp 11/30/21 1442 98.6 F (37 C)     Temp Source 11/30/21 1442 Oral     SpO2 11/30/21 1442 96 %     Weight 11/30/21 1433 143 lb 4.8 oz (65 kg)     Height 11/30/21 1433 5\' 2"  (1.575 m)     Head Circumference --      Peak Flow --      Pain Score 11/30/21 1433 0     Pain Loc --      Pain Edu? --      Excl. in GC? --     Most recent vital signs: Vitals:   11/30/21 1829 11/30/21 1920  BP: (!) 163/98 (!) 180/88  Pulse: 61 62  Resp: 18 18  Temp: 98.3 F (36.8 C)   SpO2: 98% 99%     General: Alert and in no acute distress.   Cardiovascular:  Good peripheral perfusion Respiratory: Normal respiratory effort without tachypnea or retractions. Lungs CTAB. Good air entry to the bases with no decreased or absent breath  sounds. Gastrointestinal: Bowel sounds 4 quadrants. Soft and nontender to palpation. No guarding or rigidity. No palpable masses. No distention. Musculoskeletal: Full range of motion to all extremities.  Visualization of bilateral lower extremities reveals bilateral lower extremity edema that is consistent with each leg.  There is no erythema.  No tenderness.  Pulses intact.  Sensation intact.  Edema is nonpitting in nature. Neurologic:  No gross focal neurologic deficits are appreciated.  Skin:   No rash noted Other:   ED Results / Procedures / Treatments   Labs (all labs ordered are listed, but only abnormal results are displayed) Labs Reviewed  BASIC METABOLIC PANEL - Abnormal; Notable for the following components:      Result Value   Chloride 116 (*)    CO2 21 (*)    BUN 21 (*)    All other components within normal limits  CBC - Abnormal; Notable for the following components:   RBC 3.75 (*)    Hemoglobin 11.2 (*)    HCT 35.1 (*)    All other components within normal limits  BRAIN NATRIURETIC PEPTIDE - Abnormal;  Notable for the following components:   B Natriuretic Peptide 215.1 (*)    All other components within normal limits  TROPONIN I (HIGH SENSITIVITY)  TROPONIN I (HIGH SENSITIVITY)     EKG  ED ECG REPORT I, Charline Bills Zuriyah Shatz,  personally viewed and interpreted this ECG.   Date: 11/30/2021  EKG Time: 1428 hrs.  Rate: 75 bpm  Rhythm: unchanged from previous tracings, normal sinus rhythm, largely unchanged from previous EKG from 10/21/2011  Axis: Normal axis  Intervals:none  ST&T Change: No gross ST elevation or depression noted  Normal sinus rhythm.  No STEMI.  Compared to previous EKG from 10/21/2011 no significant changes.    RADIOLOGY  I personally viewed, evaluated, and interpreted these images as part of my medical decision making, as well as reviewing the written report by the radiologist.  ED Provider Interpretation: No acute cardiopulmonary  findings on chest x-ray.  Stable pulmonary nodule in the right lung stable since 2013. No evidence of DVT on [place].  US Venous Img Lower Bilateral  Result Date: 11/30/2021 CLINICAL DATA:  Bilateral lower extremity edema EXAM: Bilateral LOWER EXTREMITY VENOUS DOPPLER ULTRASOUND TECHNIQUE: Gray-scale sonography with compression, as well as color and duplex ultrasound, were performed to evaluate the deep venous system(s) from the level of the common femoral vein through the popliteal and proximal calf veins. COMPARISON:  Ultrasound Jun 26, 2019. FINDINGS: VENOUS Normal compressibility of the common femoral, superficial femoral, and popliteal veins, as well as the visualized calf veins. Visualized portions of profunda femoral vein and great saphenous vein unremarkable. No filling defects to suggest DVT on grayscale or color Doppler imaging. Doppler waveforms show normal direction of venous flow, normal respiratory plasticity and response to augmentation. Limited views of the contralateral common femoral vein are unremarkable. OTHER None. Limitations: none IMPRESSION: No evidence of deep venous thrombosis in the lower extremities. Electronically Signed   By: Dahlia Bailiff M.D.   On: 11/30/2021 17:50   DG Chest 2 View  Result Date: 11/30/2021 CLINICAL DATA:  Shortness of breath. EXAM: CHEST - 2 VIEW COMPARISON:  Twelve/10/2015; 10/06/2011; chest CT-10/12/2011 FINDINGS: Grossly unchanged cardiac silhouette and mediastinal contours. Well-defined approximately 1.5 cm nodule overlying the right mid lung is unchanged compared to remote chest radiograph performed 09/2011 and thus of benign etiology. No discrete focal airspace opacities. No pleural effusion or pneumothorax. No evidence of edema. No acute osseous abnormalities. IMPRESSION: 1.  No acute cardiopulmonary disease. 2. Approximately 1.5 cm nodule overlying the right mid lung is unchanged since the 2013 examination and thus of benign etiology. Electronically  Signed   By: Sandi Mariscal M.D.   On: 11/30/2021 14:57    PROCEDURES:  Critical Care performed: No  Procedures   MEDICATIONS ORDERED IN ED: Medications  furosemide (LASIX) tablet 40 mg (40 mg Oral Given 11/30/21 1918)     IMPRESSION / MDM / ASSESSMENT AND PLAN / ED COURSE  I reviewed the triage vital signs and the nursing notes.                              Differential diagnosis includes, but is not limited to, DVT, colitis, CHF, peripheral edema  Patient's presentation is most consistent with acute presentation with potential threat to life or bodily function.   Patient's diagnosis is consistent with peripheral edema.  Patient presents the emergency department with a month of reported lower extremity edema.  No chest pain, though she does endorse some  intermittent exertional shortness of breath.  She recently saw her primary care provider for the first time, during this appointment it was noted that she had an elevated creatinine.  Today her creatinine is in the normal range.  Suspect that the patient had a small AKI at time of primary care visit.  She is scheduled for an outpatient ultrasound which I will recommend she keep.  This is to evaluate her kidneys.  Patient did have some peripheral edema though she states it is better today than it has been.  There was some mild edema noted though this was nonpitting.  No evidence of cellulitis.  Ultrasound was negative for DVT.  Chest x-ray, EKG and troponins are reassuring.  Do not have a high suspicion at this time for CHF.  However given the peripheral edema and reported exertional shortness of breath I do recommend that she follow-up with cardiology for further evaluation.  For very short course of Lasix.  As she will be on Lasix I will provide supplemental potassium.  Follow-up primary care as needed.  Return precautions discussed with the patient.  Patient is given ED precautions to return to the ED for any worsening or new  symptoms.    Clinical Course as of 11/30/21 1928  Tue Nov 30, 2021  1523 DG Chest 2 View [SH]    Clinical Course User Index [SH] Yvonna Alanis, [place]     FINAL CLINICAL IMPRESSION(S) / ED DIAGNOSES   Final diagnoses:  Peripheral edema     Rx / DC Orders   ED Discharge Orders          Ordered    furosemide (LASIX) 40 MG tablet  Daily        11/30/21 1907    potassium chloride SA (KLOR-CON M) 20 MEQ tablet  Daily        11/30/21 1907             Note:  This document was prepared using Dragon voice recognition software and may include unintentional dictation errors.   Racheal Patches, PA-C 11/30/21 1928    Merwyn Katos, MD 11/30/21 [phone]

## 2021-11-30 NOTE — ED Triage Notes (Signed)
Pt via POV from home. Pt presents for bilateral leg swelling for the past month, states that it has gotten worse the past 2-3 days. Bilateral pitting edema to the lower extremities noted. Pt also endorses SOB on exertion. Denies hx of CHF. Denies chest pain. Pt is A&Ox4 and NAD

## 2021-12-02 ENCOUNTER — Ambulatory Visit
Admission: RE | Admit: 2021-12-02 | Discharge: 2021-12-02 | Disposition: A | Discharge status: Home / Self Care | Payer: BLUE CROSS/BLUE SHIELD | Source: Ambulatory Visit | Attending: Internal Medicine | Admitting: Internal Medicine

## 2021-12-02 DIAGNOSIS — N1832 Chronic kidney disease, stage 3b: Secondary | ICD-10-CM | POA: Insufficient documentation

## 2021-12-16 IMAGING — US US EXTREM LOW VENOUS*R*
1 series · 14 of 24 positions shown · non-contrast
Comparison: None.

CLINICAL DATA: 54-year-old presenting with acute RIGHT calf pain
and RIGHT ankle pain.

EXAM:
RIGHT LOWER EXTREMITY VENOUS DOPPLER ULTRASOUND
TECHNIQUE: Gray-scale sonography with compression, as well as color and duplex
ultrasound, were performed to evaluate the deep venous system(s)
from the level of the common femoral vein through the popliteal and
proximal calf veins.

[Series 1: us venous img lower uni right (dvt) · portal-venous · 14 of 32 slices shown]
[im 1/32]
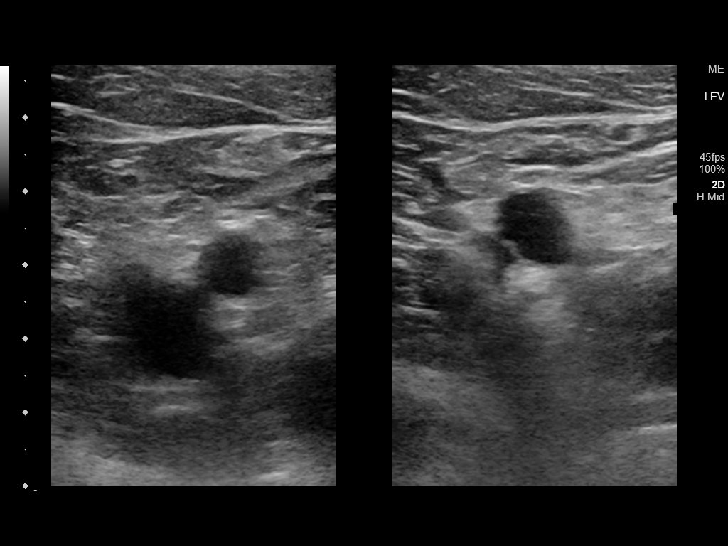
[im 3/32]
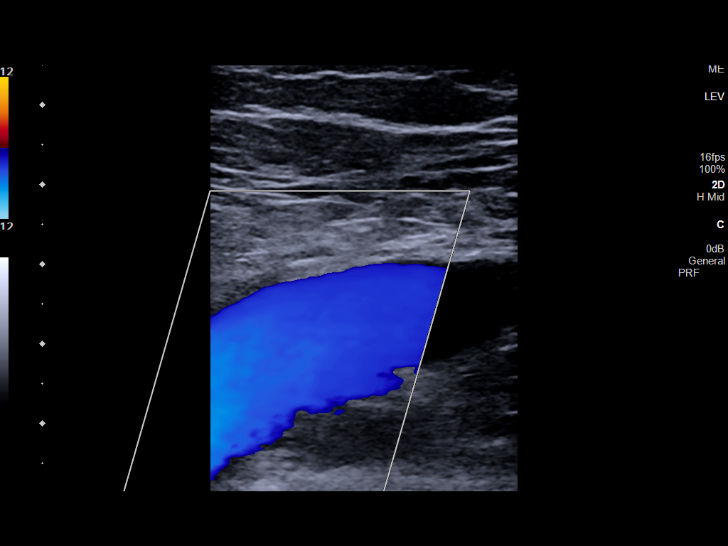
[im 6/32]
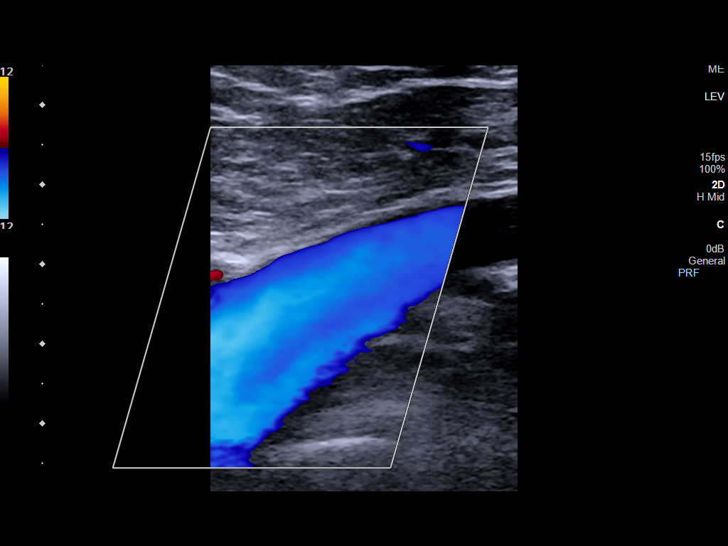
[im 9/32]
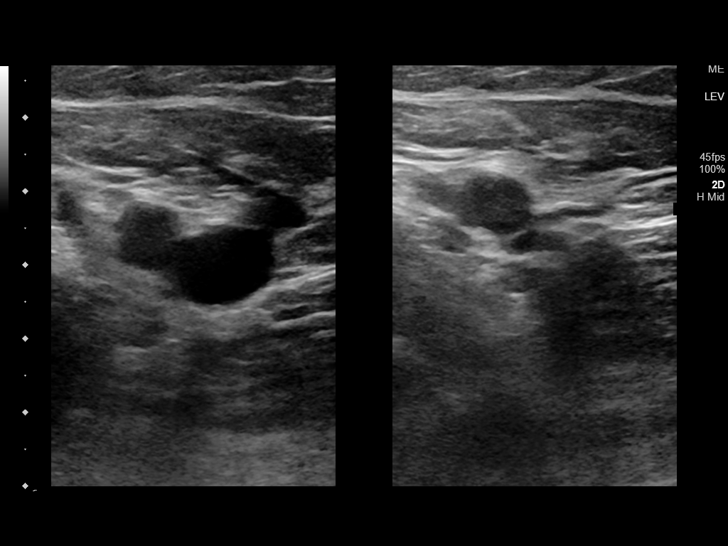
[im 10/32]
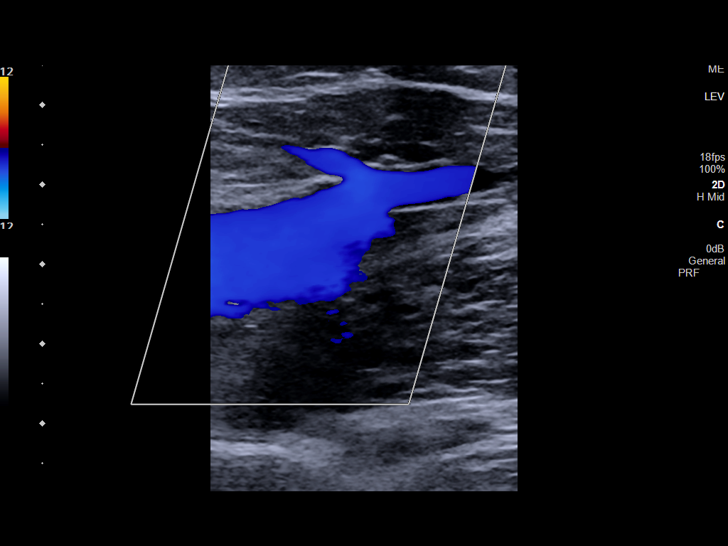
[im 13/32]
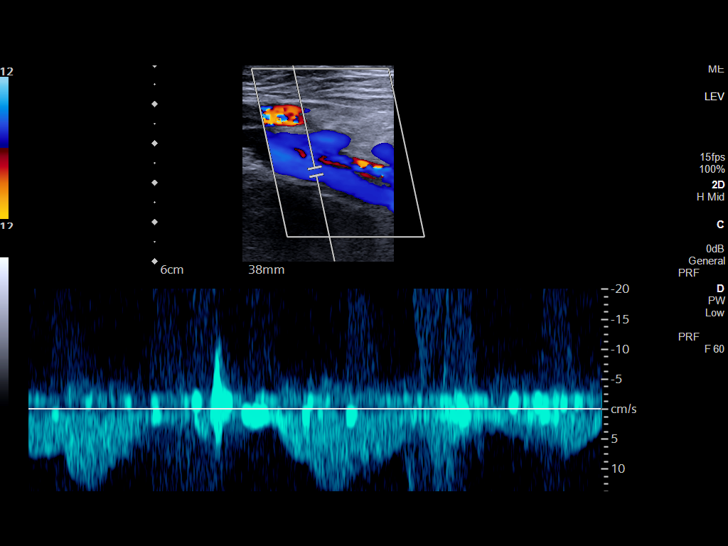
[im 15/32]
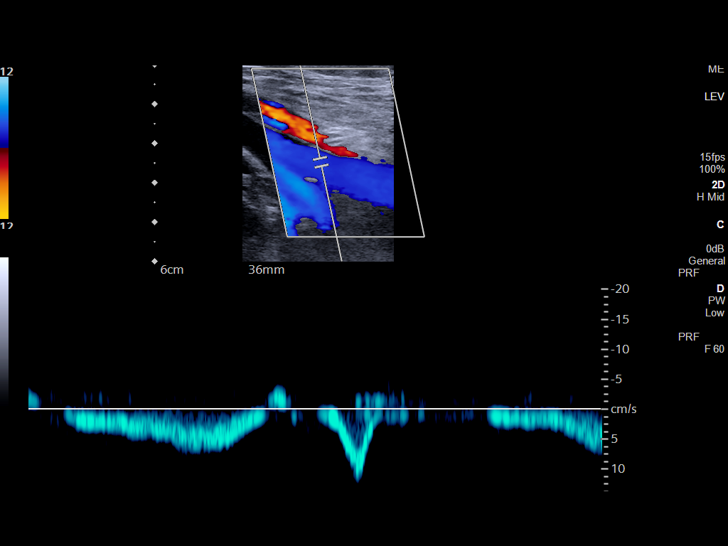
[im 17/32]
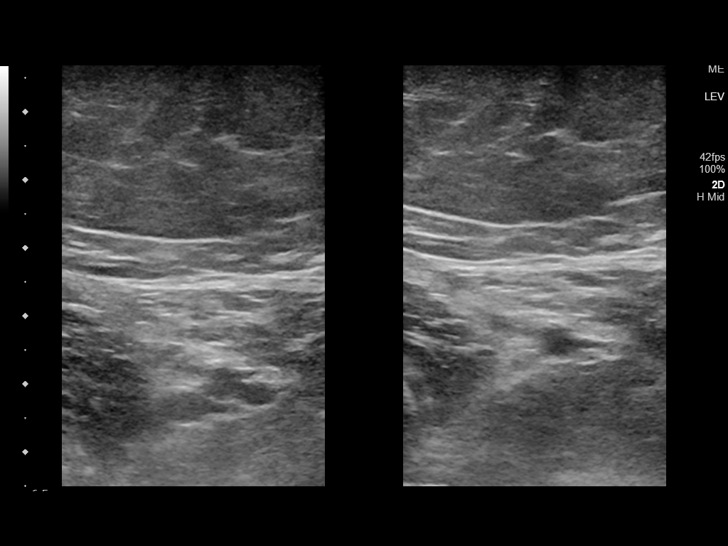
[im 19/32]
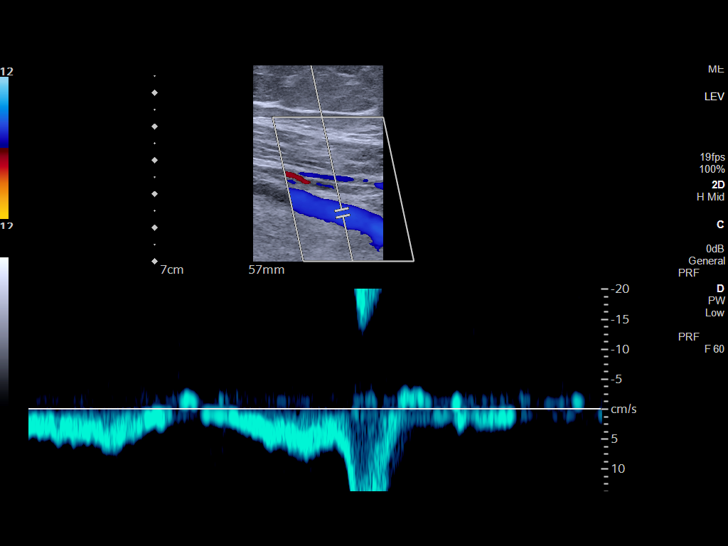
[im 22/32]
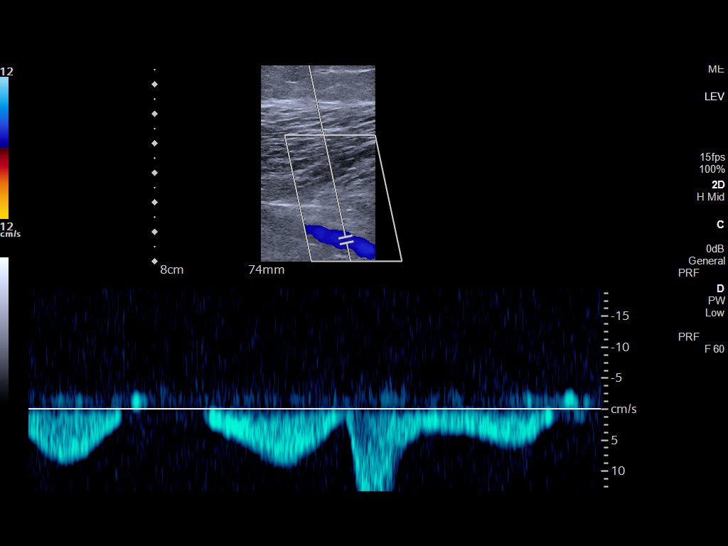
[im 25/32]
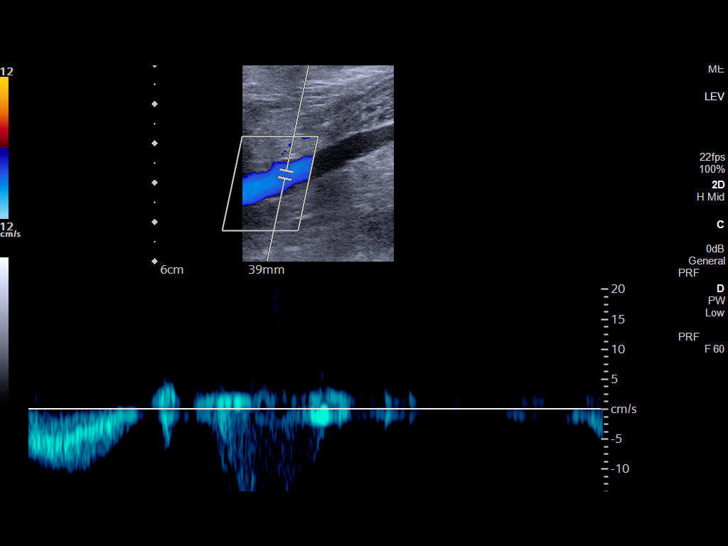
[im 26/32]
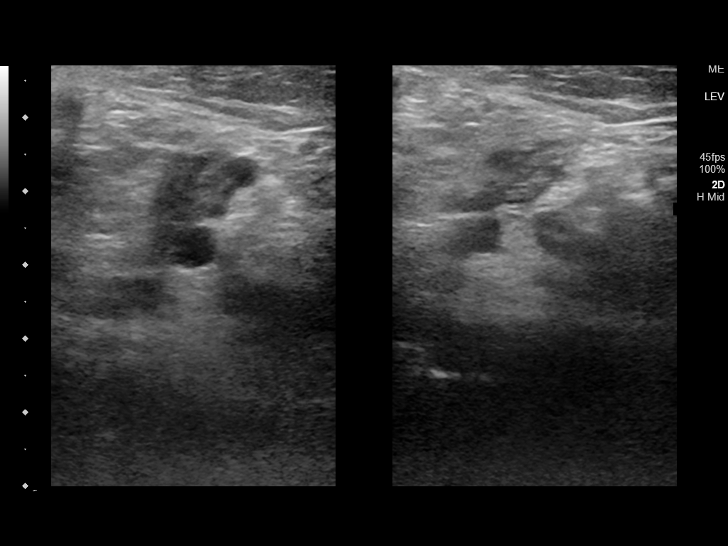
[im 29/32]
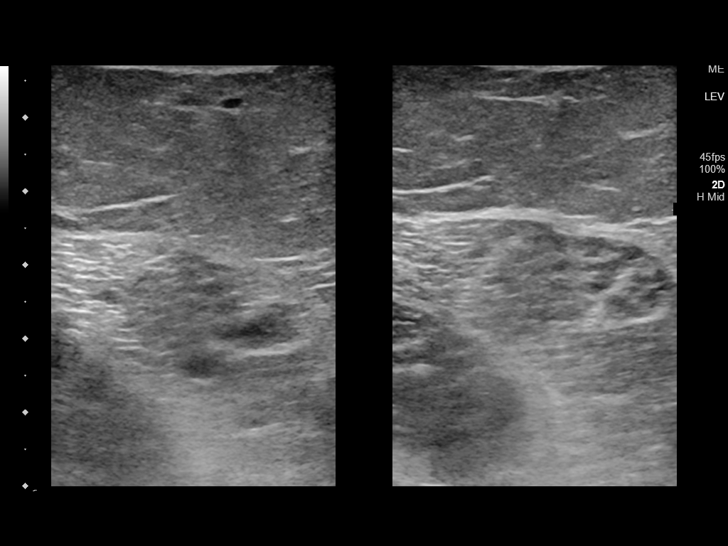
[im 32/32]
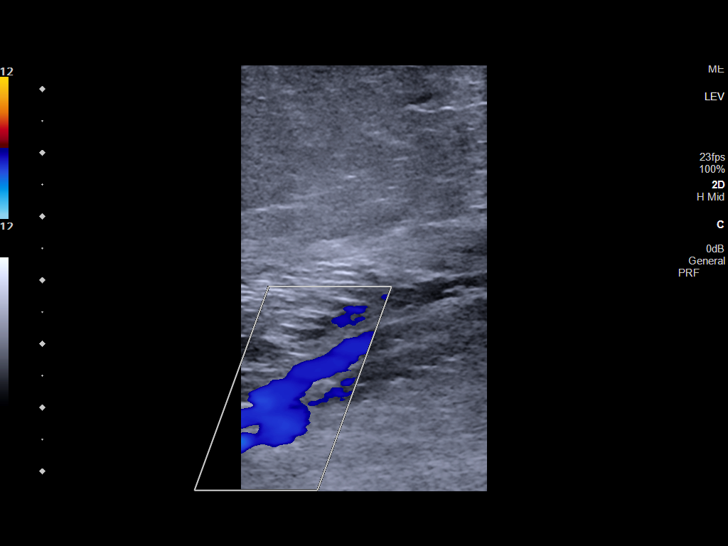

[14 of 24 positions shown; findings below may reference images not displayed]

FINDINGS: VENOUS

Normal compressibility of the common femoral, superficial femoral,
and popliteal veins, as well as the visualized calf veins.
Visualized portions of profunda femoral vein and great saphenous
vein unremarkable. No filling defects to suggest DVT on grayscale or
color Doppler imaging. Doppler waveforms show normal direction of
venous flow, normal respiratory plasticity and response to
augmentation.

The contralateral common femoral vein is widely patent with normal
compressibility and normal respiratory phasicity.

OTHER

None.

Limitations: None.
IMPRESSION: No evidence of RIGHT lower extremity DVT.
